# Patient Record
Sex: Male | Born: 1935 | Race: White | Hispanic: No | Marital: Married
Health system: Southern US, Community
[De-identification: ages and names within clinical notes are randomized; demographics above are authoritative.]

---

## 1998-01-12 ENCOUNTER — Ambulatory Visit (HOSPITAL_COMMUNITY): Admission: RE | Admit: 1998-01-12 | Discharge: 1998-01-12 | Payer: Self-pay | Admitting: Neurosurgery

## 2004-08-08 ENCOUNTER — Ambulatory Visit: Payer: Self-pay | Admitting: Dermatology

## 2005-04-10 ENCOUNTER — Other Ambulatory Visit: Payer: Self-pay

## 2005-04-10 ENCOUNTER — Ambulatory Visit: Payer: Self-pay | Admitting: Ophthalmology

## 2005-04-17 ENCOUNTER — Ambulatory Visit: Payer: Self-pay | Admitting: Ophthalmology

## 2006-04-18 ENCOUNTER — Inpatient Hospital Stay: Payer: Self-pay | Admitting: Internal Medicine

## 2006-04-18 ENCOUNTER — Other Ambulatory Visit: Payer: Self-pay

## 2007-04-26 ENCOUNTER — Other Ambulatory Visit: Payer: Self-pay

## 2007-04-26 ENCOUNTER — Emergency Department: Payer: Self-pay | Admitting: Emergency Medicine

## 2007-06-03 ENCOUNTER — Other Ambulatory Visit: Payer: Self-pay

## 2007-06-03 ENCOUNTER — Inpatient Hospital Stay: Payer: Self-pay | Admitting: Internal Medicine

## 2007-07-02 ENCOUNTER — Inpatient Hospital Stay: Payer: Self-pay | Admitting: Internal Medicine

## 2007-07-02 ENCOUNTER — Other Ambulatory Visit: Payer: Self-pay

## 2007-07-10 ENCOUNTER — Inpatient Hospital Stay: Payer: Self-pay | Admitting: Internal Medicine

## 2007-08-19 ENCOUNTER — Ambulatory Visit: Payer: Self-pay | Admitting: Internal Medicine

## 2007-09-06 ENCOUNTER — Other Ambulatory Visit: Payer: Self-pay

## 2007-09-06 ENCOUNTER — Inpatient Hospital Stay: Payer: Self-pay | Admitting: Internal Medicine

## 2007-12-10 ENCOUNTER — Ambulatory Visit: Payer: Self-pay | Admitting: Podiatry

## 2008-04-21 ENCOUNTER — Ambulatory Visit: Payer: Self-pay | Admitting: Ophthalmology

## 2008-04-27 ENCOUNTER — Ambulatory Visit: Payer: Self-pay | Admitting: Ophthalmology

## 2008-09-19 ENCOUNTER — Emergency Department: Payer: Self-pay | Admitting: Emergency Medicine

## 2008-12-25 ENCOUNTER — Emergency Department: Payer: Self-pay | Admitting: Emergency Medicine

## 2009-01-01 ENCOUNTER — Ambulatory Visit: Payer: Self-pay | Admitting: Internal Medicine

## 2010-09-02 ENCOUNTER — Ambulatory Visit: Payer: Self-pay | Admitting: Internal Medicine

## 2010-09-10 ENCOUNTER — Inpatient Hospital Stay: Payer: Self-pay | Admitting: Internal Medicine

## 2010-10-02 ENCOUNTER — Inpatient Hospital Stay: Payer: Self-pay | Admitting: Internal Medicine

## 2010-10-02 ENCOUNTER — Ambulatory Visit: Payer: Self-pay | Admitting: Internal Medicine

## 2011-03-31 ENCOUNTER — Emergency Department: Payer: Self-pay | Admitting: Emergency Medicine

## 2011-06-02 ENCOUNTER — Ambulatory Visit: Payer: Self-pay | Admitting: Internal Medicine

## 2011-06-20 ENCOUNTER — Inpatient Hospital Stay: Payer: Self-pay | Admitting: Internal Medicine

## 2011-06-20 LAB — CK TOTAL AND CKMB (NOT AT ARMC)
CK, Total: 71 U/L (ref 35–232)
CK-MB: 1.5 ng/mL (ref 0.5–3.6)
CK-MB: 2.6 ng/mL (ref 0.5–3.6)

## 2011-06-20 LAB — PROTIME-INR
INR: 1
Prothrombin Time: 13.4 secs (ref 11.5–14.7)

## 2011-06-20 LAB — COMPREHENSIVE METABOLIC PANEL
Albumin: 2.8 g/dL — ABNORMAL LOW (ref 3.4–5.0)
Alkaline Phosphatase: 44 U/L — ABNORMAL LOW (ref 50–136)
BUN: 55 mg/dL — ABNORMAL HIGH (ref 7–18)
Bilirubin,Total: 0.4 mg/dL (ref 0.2–1.0)
Chloride: 102 mmol/L (ref 98–107)
Creatinine: 2.95 mg/dL — ABNORMAL HIGH (ref 0.60–1.30)
EGFR (Non-African Amer.): 21 — ABNORMAL LOW
Glucose: 80 mg/dL (ref 65–99)
Osmolality: 290 (ref 275–301)
SGPT (ALT): 16 U/L
Sodium: 138 mmol/L (ref 136–145)

## 2011-06-20 LAB — CBC
HCT: 39 % — ABNORMAL LOW (ref 40.0–52.0)
HGB: 12.4 g/dL — ABNORMAL LOW (ref 13.0–18.0)
MCH: 28.8 pg (ref 26.0–34.0)
MCHC: 31.7 g/dL — ABNORMAL LOW (ref 32.0–36.0)

## 2011-06-20 LAB — TROPONIN I
Troponin-I: 0.05 ng/mL
Troponin-I: 0.04 ng/mL

## 2011-06-20 LAB — TSH: Thyroid Stimulating Horm: <0.01 u[IU]/mL — ABNORMAL LOW

## 2011-06-21 LAB — CBC WITH DIFFERENTIAL/PLATELET
HGB: 12.3 g/dL — ABNORMAL LOW (ref 13.0–18.0)
Lymphocyte #: 1 10*3/uL (ref 1.0–3.6)
Lymphocyte %: 5.2 %
MCH: 29.7 pg (ref 26.0–34.0)
MCHC: 32.5 g/dL (ref 32.0–36.0)
MCV: 92 fL (ref 80–100)
Monocyte %: 9.1 %
Neutrophil #: 16.3 10*3/uL — ABNORMAL HIGH (ref 1.4–6.5)
Neutrophil %: 84.5 %
Platelet: 295 10*3/uL (ref 150–440)
RBC: 4.13 10*6/uL — ABNORMAL LOW (ref 4.40–5.90)
RDW: 13.6 % (ref 11.5–14.5)
WBC: 19.3 10*3/uL — ABNORMAL HIGH (ref 3.8–10.6)

## 2011-06-21 LAB — COMPREHENSIVE METABOLIC PANEL
Alkaline Phosphatase: 42 U/L — ABNORMAL LOW (ref 50–136)
Calcium, Total: 8.2 mg/dL — ABNORMAL LOW (ref 8.5–10.1)
Co2: 27 mmol/L (ref 21–32)
Creatinine: 2.79 mg/dL — ABNORMAL HIGH (ref 0.60–1.30)
EGFR (Non-African Amer.): 23 — ABNORMAL LOW
SGPT (ALT): 13 U/L
Sodium: 142 mmol/L (ref 136–145)

## 2011-06-21 LAB — PROTIME-INR
INR: 1
Prothrombin Time: 13.8 secs (ref 11.5–14.7)

## 2011-06-21 LAB — URINALYSIS, COMPLETE
Glucose,UR: 150 mg/dL (ref 0–75)
Hyaline Cast: 24
Nitrite: NEGATIVE
Ph: 5 (ref 4.5–8.0)
Protein: 100
Specific Gravity: 1.018 (ref 1.003–1.030)
WBC UR: 17 /HPF (ref 0–5)

## 2011-06-21 LAB — CK TOTAL AND CKMB (NOT AT ARMC)
CK, Total: 54 U/L (ref 35–232)
CK-MB: 2.3 ng/mL (ref 0.5–3.6)

## 2011-06-22 LAB — BASIC METABOLIC PANEL
Anion Gap: 14 (ref 7–16)
Calcium, Total: 8.1 mg/dL — ABNORMAL LOW (ref 8.5–10.1)
Chloride: 105 mmol/L (ref 98–107)
Co2: 21 mmol/L (ref 21–32)
Creatinine: 3.05 mg/dL — ABNORMAL HIGH (ref 0.60–1.30)
Osmolality: 302 (ref 275–301)
Sodium: 140 mmol/L (ref 136–145)

## 2011-06-22 LAB — CBC WITH DIFFERENTIAL/PLATELET
Basophil #: 0 10*3/uL (ref 0.0–0.1)
Basophil %: 0.3 %
Eosinophil #: 0.7 10*3/uL (ref 0.0–0.7)
Eosinophil %: 4.3 %
HCT: 34.5 % — ABNORMAL LOW (ref 40.0–52.0)
MCH: 29.9 pg (ref 26.0–34.0)
MCV: 92 fL (ref 80–100)
Monocyte %: 10.7 %
Neutrophil %: 76.4 %
Platelet: 277 10*3/uL (ref 150–440)
RBC: 3.77 10*6/uL — ABNORMAL LOW (ref 4.40–5.90)
WBC: 16.4 10*3/uL — ABNORMAL HIGH (ref 3.8–10.6)

## 2011-06-23 LAB — BASIC METABOLIC PANEL
Anion Gap: 9 (ref 7–16)
BUN: 63 mg/dL — ABNORMAL HIGH (ref 7–18)
Chloride: 103 mmol/L (ref 98–107)
Co2: 26 mmol/L (ref 21–32)
EGFR (Non-African Amer.): 19 — ABNORMAL LOW
Osmolality: 299 (ref 275–301)
Potassium: 4.9 mmol/L (ref 3.5–5.1)
Sodium: 138 mmol/L (ref 136–145)

## 2011-06-23 LAB — URINALYSIS, COMPLETE
Hyaline Cast: 27
Ketone: NEGATIVE
Nitrite: NEGATIVE
Ph: 5 (ref 4.5–8.0)
Protein: 30
RBC,UR: 39 /HPF (ref 0–5)
Specific Gravity: 1.019 (ref 1.003–1.030)
WBC UR: 137 /HPF (ref 0–5)

## 2011-06-23 LAB — HEPATIC FUNCTION PANEL A (ARMC)
Albumin: 2.5 g/dL — ABNORMAL LOW (ref 3.4–5.0)
Bilirubin, Direct: 0.1 mg/dL (ref 0.00–0.20)
Bilirubin,Total: 0.6 mg/dL (ref 0.2–1.0)
SGOT(AST): 17 U/L (ref 15–37)
Total Protein: 7 g/dL (ref 6.4–8.2)

## 2011-06-23 LAB — HEMATOCRIT: HCT: 34.3 % — ABNORMAL LOW (ref 40.0–52.0)

## 2011-06-24 LAB — CBC WITH DIFFERENTIAL/PLATELET
Basophil %: 0.3 %
Eosinophil %: 4.8 %
HCT: 34.8 % — ABNORMAL LOW (ref 40.0–52.0)
MCH: 29.9 pg (ref 26.0–34.0)
MCV: 91 fL (ref 80–100)
Monocyte %: 7.9 %
Neutrophil %: 78.3 %
Platelet: 311 10*3/uL (ref 150–440)
RBC: 3.81 10*6/uL — ABNORMAL LOW (ref 4.40–5.90)
WBC: 18 10*3/uL — ABNORMAL HIGH (ref 3.8–10.6)

## 2011-06-24 LAB — BASIC METABOLIC PANEL
Anion Gap: 9 (ref 7–16)
BUN: 70 mg/dL — ABNORMAL HIGH (ref 7–18)
Chloride: 106 mmol/L (ref 98–107)
Co2: 27 mmol/L (ref 21–32)
EGFR (African American): 22 — ABNORMAL LOW
EGFR (Non-African Amer.): 18 — ABNORMAL LOW
Glucose: 121 mg/dL — ABNORMAL HIGH (ref 65–99)
Osmolality: 305 (ref 275–301)

## 2011-06-24 LAB — IRON AND TIBC
Iron Bind.Cap.(Total): 204 ug/dL — ABNORMAL LOW (ref 250–450)
Iron Saturation: 14 %
Unbound Iron-Bind.Cap.: 176 ug/dL

## 2011-06-24 LAB — FERRITIN: Ferritin (ARMC): 255 ng/mL (ref 8–388)

## 2011-06-25 LAB — URINE CULTURE

## 2011-06-25 LAB — BASIC METABOLIC PANEL
Anion Gap: 12 (ref 7–16)
BUN: 73 mg/dL — ABNORMAL HIGH (ref 7–18)
Chloride: 106 mmol/L (ref 98–107)
Co2: 22 mmol/L (ref 21–32)
EGFR (African American): 23 — ABNORMAL LOW
EGFR (Non-African Amer.): 19 — ABNORMAL LOW
Glucose: 209 mg/dL — ABNORMAL HIGH (ref 65–99)
Osmolality: 307 (ref 275–301)
Sodium: 140 mmol/L (ref 136–145)

## 2011-06-26 LAB — CBC WITH DIFFERENTIAL/PLATELET
Basophil #: 0 10*3/uL (ref 0.0–0.1)
Basophil %: 0 %
Eosinophil #: 0 10*3/uL (ref 0.0–0.7)
Eosinophil %: 0 %
HCT: 34.2 % — ABNORMAL LOW (ref 40.0–52.0)
HGB: 11.1 g/dL — ABNORMAL LOW (ref 13.0–18.0)
Lymphocyte #: 0.9 10*3/uL — ABNORMAL LOW (ref 1.0–3.6)
MCH: 29.8 pg (ref 26.0–34.0)
MCHC: 32.6 g/dL (ref 32.0–36.0)
MCV: 92 fL (ref 80–100)
Monocyte #: 1.1 10*3/uL — ABNORMAL HIGH (ref 0.0–0.7)
Monocyte %: 6.1 %
RBC: 3.74 10*6/uL — ABNORMAL LOW (ref 4.40–5.90)
RDW: 12.7 % (ref 11.5–14.5)

## 2011-06-26 LAB — BASIC METABOLIC PANEL
Anion Gap: 11 (ref 7–16)
BUN: 77 mg/dL — ABNORMAL HIGH (ref 7–18)
Calcium, Total: 8.4 mg/dL — ABNORMAL LOW (ref 8.5–10.1)
Chloride: 107 mmol/L (ref 98–107)
Co2: 23 mmol/L (ref 21–32)
Creatinine: 3.1 mg/dL — ABNORMAL HIGH (ref 0.60–1.30)
Sodium: 141 mmol/L (ref 136–145)

## 2011-06-26 LAB — CULTURE, BLOOD (SINGLE)

## 2011-06-27 LAB — BASIC METABOLIC PANEL
Anion Gap: 10 (ref 7–16)
BUN: 79 mg/dL — ABNORMAL HIGH (ref 7–18)
Calcium, Total: 8.2 mg/dL — ABNORMAL LOW (ref 8.5–10.1)
Chloride: 108 mmol/L — ABNORMAL HIGH (ref 98–107)
Co2: 25 mmol/L (ref 21–32)
Creatinine: 3.11 mg/dL — ABNORMAL HIGH (ref 0.60–1.30)
EGFR (African American): 24 — ABNORMAL LOW
EGFR (Non-African Amer.): 20 — ABNORMAL LOW
Glucose: 221 mg/dL — ABNORMAL HIGH (ref 65–99)
Osmolality: 315 (ref 275–301)
Potassium: 5.9 mmol/L — ABNORMAL HIGH (ref 3.5–5.1)

## 2011-06-27 LAB — SEDIMENTATION RATE: Erythrocyte Sed Rate: 18 mm/hr (ref 0–20)

## 2011-06-28 LAB — BASIC METABOLIC PANEL
Anion Gap: 9 (ref 7–16)
BUN: 80 mg/dL — ABNORMAL HIGH (ref 7–18)
Chloride: 108 mmol/L — ABNORMAL HIGH (ref 98–107)
Creatinine: 3.07 mg/dL — ABNORMAL HIGH (ref 0.60–1.30)
EGFR (Non-African Amer.): 20 — ABNORMAL LOW

## 2011-06-28 LAB — CBC WITH DIFFERENTIAL/PLATELET
Basophil %: 0 %
HCT: 35.8 % — ABNORMAL LOW (ref 40.0–52.0)
HGB: 11.6 g/dL — ABNORMAL LOW (ref 13.0–18.0)
Lymphocyte %: 6.1 %
Monocyte #: 1.3 10*3/uL — ABNORMAL HIGH (ref 0.0–0.7)
Neutrophil #: 18.5 10*3/uL — ABNORMAL HIGH (ref 1.4–6.5)
RBC: 3.91 10*6/uL — ABNORMAL LOW (ref 4.40–5.90)
RDW: 13.4 % (ref 11.5–14.5)
WBC: 21.1 10*3/uL — ABNORMAL HIGH (ref 3.8–10.6)

## 2011-06-28 LAB — CULTURE, BLOOD (SINGLE)

## 2011-06-29 LAB — BASIC METABOLIC PANEL
BUN: 77 mg/dL — ABNORMAL HIGH (ref 7–18)
Creatinine: 2.88 mg/dL — ABNORMAL HIGH (ref 0.60–1.30)
EGFR (Non-African Amer.): 22 — ABNORMAL LOW
Glucose: 177 mg/dL — ABNORMAL HIGH (ref 65–99)
Osmolality: 310 (ref 275–301)
Potassium: 6.1 mmol/L — ABNORMAL HIGH (ref 3.5–5.1)
Sodium: 142 mmol/L (ref 136–145)

## 2011-06-29 LAB — WBC: WBC: 21.3 10*3/uL — ABNORMAL HIGH (ref 3.8–10.6)

## 2011-06-30 LAB — BASIC METABOLIC PANEL
BUN: 74 mg/dL — ABNORMAL HIGH (ref 7–18)
Calcium, Total: 7.9 mg/dL — ABNORMAL LOW (ref 8.5–10.1)
Chloride: 107 mmol/L (ref 98–107)
Co2: 25 mmol/L (ref 21–32)
Creatinine: 2.73 mg/dL — ABNORMAL HIGH (ref 0.60–1.30)
EGFR (African American): 28 — ABNORMAL LOW
Glucose: 170 mg/dL — ABNORMAL HIGH (ref 65–99)
Osmolality: 305 (ref 275–301)
Potassium: 4.9 mmol/L (ref 3.5–5.1)
Sodium: 140 mmol/L (ref 136–145)

## 2011-06-30 LAB — CBC WITH DIFFERENTIAL/PLATELET
Basophil #: 0 10*3/uL (ref 0.0–0.1)
Eosinophil %: 0.5 %
HCT: 35 % — ABNORMAL LOW (ref 40.0–52.0)
HGB: 11.3 g/dL — ABNORMAL LOW (ref 13.0–18.0)
MCH: 29.6 pg (ref 26.0–34.0)
MCHC: 32.3 g/dL (ref 32.0–36.0)
MCV: 91 fL (ref 80–100)
Monocyte #: 1.1 10*3/uL — ABNORMAL HIGH (ref 0.0–0.7)
Neutrophil #: 19.6 10*3/uL — ABNORMAL HIGH (ref 1.4–6.5)
Neutrophil %: 89.4 %
Platelet: 333 10*3/uL (ref 150–440)
RBC: 3.82 10*6/uL — ABNORMAL LOW (ref 4.40–5.90)
WBC: 22 10*3/uL — ABNORMAL HIGH (ref 3.8–10.6)

## 2011-07-01 ENCOUNTER — Ambulatory Visit: Payer: Self-pay | Admitting: Urology

## 2011-07-01 LAB — CBC WITH DIFFERENTIAL/PLATELET
Basophil #: 0 10*3/uL (ref 0.0–0.1)
Basophil %: 0 %
Eosinophil #: 0.1 10*3/uL (ref 0.0–0.7)
Eosinophil %: 0.4 %
Lymphocyte #: 1.3 10*3/uL (ref 1.0–3.6)
MCH: 29.5 pg (ref 26.0–34.0)
MCHC: 32.5 g/dL (ref 32.0–36.0)
Monocyte #: 1.1 10*3/uL — ABNORMAL HIGH (ref 0.0–0.7)
Monocyte %: 5.4 %
Neutrophil #: 18.3 10*3/uL — ABNORMAL HIGH (ref 1.4–6.5)
Neutrophil %: 88 %
Platelet: 315 10*3/uL (ref 150–440)
RDW: 13.1 % (ref 11.5–14.5)

## 2011-07-01 LAB — BASIC METABOLIC PANEL
Anion Gap: 7 (ref 7–16)
BUN: 72 mg/dL — ABNORMAL HIGH (ref 7–18)
Chloride: 108 mmol/L — ABNORMAL HIGH (ref 98–107)
Co2: 28 mmol/L (ref 21–32)
Creatinine: 2.52 mg/dL — ABNORMAL HIGH (ref 0.60–1.30)
EGFR (African American): 31 — ABNORMAL LOW
EGFR (Non-African Amer.): 26 — ABNORMAL LOW
Glucose: 134 mg/dL — ABNORMAL HIGH (ref 65–99)
Osmolality: 308 (ref 275–301)
Sodium: 143 mmol/L (ref 136–145)

## 2011-07-02 LAB — BASIC METABOLIC PANEL
Anion Gap: 8 (ref 7–16)
BUN: 66 mg/dL — ABNORMAL HIGH (ref 7–18)
Calcium, Total: 7.9 mg/dL — ABNORMAL LOW (ref 8.5–10.1)
Co2: 27 mmol/L (ref 21–32)
Creatinine: 2.64 mg/dL — ABNORMAL HIGH (ref 0.60–1.30)
EGFR (Non-African Amer.): 24 — ABNORMAL LOW
Glucose: 61 mg/dL — ABNORMAL LOW (ref 65–99)
Potassium: 4.2 mmol/L (ref 3.5–5.1)

## 2011-07-03 ENCOUNTER — Ambulatory Visit: Payer: Self-pay | Admitting: Internal Medicine

## 2011-07-03 LAB — CULTURE, BLOOD (SINGLE)

## 2011-07-04 LAB — BASIC METABOLIC PANEL
Anion Gap: 8 (ref 7–16)
BUN: 58 mg/dL — ABNORMAL HIGH (ref 7–18)
Calcium, Total: 8 mg/dL — ABNORMAL LOW (ref 8.5–10.1)
Chloride: 107 mmol/L (ref 98–107)
Co2: 26 mmol/L (ref 21–32)
EGFR (African American): 32 — ABNORMAL LOW
EGFR (Non-African Amer.): 26 — ABNORMAL LOW
Glucose: 107 mg/dL — ABNORMAL HIGH (ref 65–99)
Osmolality: 298 (ref 275–301)
Potassium: 4.7 mmol/L (ref 3.5–5.1)

## 2011-07-04 LAB — CBC WITH DIFFERENTIAL/PLATELET
Basophil #: 0 10*3/uL (ref 0.0–0.1)
Basophil %: 0.2 %
Eosinophil %: 2.5 %
HGB: 11 g/dL — ABNORMAL LOW (ref 13.0–18.0)
Lymphocyte %: 8.5 %
MCH: 29.7 pg (ref 26.0–34.0)
MCV: 92 fL (ref 80–100)
Monocyte #: 1.2 10*3/uL — ABNORMAL HIGH (ref 0.0–0.7)
Platelet: 303 10*3/uL (ref 150–440)
RBC: 3.71 10*6/uL — ABNORMAL LOW (ref 4.40–5.90)
RDW: 13.3 % (ref 11.5–14.5)
WBC: 20.3 10*3/uL — ABNORMAL HIGH (ref 3.8–10.6)

## 2011-07-05 LAB — WBC: WBC: 18 10*3/uL — ABNORMAL HIGH (ref 3.8–10.6)

## 2011-07-05 LAB — CREATININE, SERUM: Creatinine: 2.57 mg/dL — ABNORMAL HIGH (ref 0.60–1.30)

## 2011-08-02 ENCOUNTER — Ambulatory Visit: Payer: Self-pay | Admitting: Internal Medicine

## 2011-08-09 ENCOUNTER — Inpatient Hospital Stay: Payer: Self-pay | Admitting: Internal Medicine

## 2011-08-09 LAB — CBC WITH DIFFERENTIAL/PLATELET
Basophil %: 0.4 %
Eosinophil #: 0.2 10*3/uL (ref 0.0–0.7)
HCT: 38.3 % — ABNORMAL LOW (ref 40.0–52.0)
HGB: 12.5 g/dL — ABNORMAL LOW (ref 13.0–18.0)
Lymphocyte %: 10.4 %
MCV: 90 fL (ref 80–100)
Monocyte %: 12.3 %
Neutrophil #: 10.6 10*3/uL — ABNORMAL HIGH (ref 1.4–6.5)
Neutrophil %: 75.5 %
Platelet: 342 10*3/uL (ref 150–440)
RBC: 4.26 10*6/uL — ABNORMAL LOW (ref 4.40–5.90)
RDW: 15.6 % — ABNORMAL HIGH (ref 11.5–14.5)

## 2011-08-09 LAB — CK TOTAL AND CKMB (NOT AT ARMC)
CK, Total: 418 U/L — ABNORMAL HIGH (ref 35–232)
CK, Total: 484 U/L — ABNORMAL HIGH (ref 35–232)
CK-MB: 2 ng/mL (ref 0.5–3.6)
CK-MB: 2.6 ng/mL (ref 0.5–3.6)

## 2011-08-09 LAB — COMPREHENSIVE METABOLIC PANEL
Alkaline Phosphatase: 62 U/L (ref 50–136)
Anion Gap: 13 (ref 7–16)
BUN: 17 mg/dL (ref 7–18)
Calcium, Total: 8.8 mg/dL (ref 8.5–10.1)
Glucose: 198 mg/dL — ABNORMAL HIGH (ref 65–99)
SGOT(AST): 38 U/L — ABNORMAL HIGH (ref 15–37)
SGPT (ALT): 21 U/L
Sodium: 139 mmol/L (ref 136–145)
Total Protein: 7.1 g/dL (ref 6.4–8.2)

## 2011-08-09 LAB — TROPONIN I
Troponin-I: 0.27 ng/mL — ABNORMAL HIGH
Troponin-I: 0.26 ng/mL — ABNORMAL HIGH

## 2011-08-10 LAB — COMPREHENSIVE METABOLIC PANEL
Albumin: 2 g/dL — ABNORMAL LOW (ref 3.4–5.0)
Alkaline Phosphatase: 42 U/L — ABNORMAL LOW (ref 50–136)
BUN: 19 mg/dL — ABNORMAL HIGH (ref 7–18)
Bilirubin,Total: 0.4 mg/dL (ref 0.2–1.0)
Calcium, Total: 7.3 mg/dL — ABNORMAL LOW (ref 8.5–10.1)
EGFR (African American): 30 — ABNORMAL LOW
Glucose: 124 mg/dL — ABNORMAL HIGH (ref 65–99)
Osmolality: 291 (ref 275–301)
Potassium: 3.6 mmol/L (ref 3.5–5.1)
SGOT(AST): 37 U/L (ref 15–37)
Total Protein: 5.5 g/dL — ABNORMAL LOW (ref 6.4–8.2)

## 2011-08-10 LAB — CBC WITH DIFFERENTIAL/PLATELET
Basophil #: 0 10*3/uL (ref 0.0–0.1)
Basophil %: 0.5 %
Eosinophil #: 0.6 10*3/uL (ref 0.0–0.7)
Eosinophil %: 5.7 %
HCT: 33.3 % — ABNORMAL LOW (ref 40.0–52.0)
HGB: 10.6 g/dL — ABNORMAL LOW (ref 13.0–18.0)
Lymphocyte %: 10.9 %
MCV: 92 fL (ref 80–100)
Monocyte %: 13.4 %
Neutrophil #: 7.3 10*3/uL — ABNORMAL HIGH (ref 1.4–6.5)
Neutrophil %: 69.5 %
Platelet: 270 10*3/uL (ref 150–440)
RBC: 3.64 10*6/uL — ABNORMAL LOW (ref 4.40–5.90)
RDW: 15.9 % — ABNORMAL HIGH (ref 11.5–14.5)

## 2011-08-10 LAB — URINALYSIS, COMPLETE
Bilirubin,UR: NEGATIVE
Glucose,UR: NEGATIVE mg/dL (ref 0–75)
Hyaline Cast: 4
Ketone: NEGATIVE
Ph: 5 (ref 4.5–8.0)
RBC,UR: 844 /HPF (ref 0–5)
Specific Gravity: 1.016 (ref 1.003–1.030)
WBC UR: 120 /HPF (ref 0–5)

## 2011-08-10 LAB — CK TOTAL AND CKMB (NOT AT ARMC): CK-MB: 2.5 ng/mL (ref 0.5–3.6)

## 2011-08-11 LAB — BASIC METABOLIC PANEL
BUN: 25 mg/dL — ABNORMAL HIGH (ref 7–18)
Calcium, Total: 7.4 mg/dL — ABNORMAL LOW (ref 8.5–10.1)
Creatinine: 2.55 mg/dL — ABNORMAL HIGH (ref 0.60–1.30)
EGFR (Non-African Amer.): 21 — ABNORMAL LOW
Glucose: 136 mg/dL — ABNORMAL HIGH (ref 65–99)
Sodium: 143 mmol/L (ref 136–145)

## 2011-08-11 LAB — CBC WITH DIFFERENTIAL/PLATELET
HGB: 10.5 g/dL — ABNORMAL LOW (ref 13.0–18.0)
Lymphocyte #: 1.5 10*3/uL (ref 1.0–3.6)
Lymphocyte %: 13.4 %
MCH: 29.3 pg (ref 26.0–34.0)
MCV: 92 fL (ref 80–100)
Monocyte #: 1.4 x10 3/mm — ABNORMAL HIGH (ref 0.2–1.0)
Neutrophil #: 6.8 10*3/uL — ABNORMAL HIGH (ref 1.4–6.5)
Neutrophil %: 59.9 %
Platelet: 282 10*3/uL (ref 150–440)
RBC: 3.59 10*6/uL — ABNORMAL LOW (ref 4.40–5.90)
RDW: 15.8 % — ABNORMAL HIGH (ref 11.5–14.5)

## 2011-08-11 LAB — URINE CULTURE

## 2011-08-12 LAB — CBC WITH DIFFERENTIAL/PLATELET
Basophil #: 0 10*3/uL (ref 0.0–0.1)
Basophil %: 0.4 %
Eosinophil %: 15.7 %
HCT: 31.6 % — ABNORMAL LOW (ref 40.0–52.0)
HGB: 10.2 g/dL — ABNORMAL LOW (ref 13.0–18.0)
MCH: 29.7 pg (ref 26.0–34.0)
MCHC: 32.4 g/dL (ref 32.0–36.0)
Monocyte #: 0.9 x10 3/mm (ref 0.2–1.0)
Neutrophil #: 6.1 10*3/uL (ref 1.4–6.5)
Neutrophil %: 60.6 %
Platelet: 277 10*3/uL (ref 150–440)
RBC: 3.44 10*6/uL — ABNORMAL LOW (ref 4.40–5.90)
WBC: 10.1 10*3/uL (ref 3.8–10.6)

## 2011-08-12 LAB — BASIC METABOLIC PANEL
Anion Gap: 6 — ABNORMAL LOW (ref 7–16)
BUN: 28 mg/dL — ABNORMAL HIGH (ref 7–18)
Chloride: 109 mmol/L — ABNORMAL HIGH (ref 98–107)
Creatinine: 2.72 mg/dL — ABNORMAL HIGH (ref 0.60–1.30)
Glucose: 174 mg/dL — ABNORMAL HIGH (ref 65–99)
Osmolality: 293 (ref 275–301)
Sodium: 142 mmol/L (ref 136–145)

## 2011-08-13 LAB — BASIC METABOLIC PANEL
Calcium, Total: 7.7 mg/dL — ABNORMAL LOW (ref 8.5–10.1)
Creatinine: 2.48 mg/dL — ABNORMAL HIGH (ref 0.60–1.30)
EGFR (African American): 25 — ABNORMAL LOW
EGFR (Non-African Amer.): 22 — ABNORMAL LOW
Osmolality: 293 (ref 275–301)
Potassium: 3.8 mmol/L (ref 3.5–5.1)

## 2011-08-14 LAB — CBC WITH DIFFERENTIAL/PLATELET
Basophil #: 0.1 10*3/uL (ref 0.0–0.1)
Basophil %: 0.7 %
Eosinophil #: 1.2 10*3/uL — ABNORMAL HIGH (ref 0.0–0.7)
Eosinophil %: 11.5 %
HGB: 10.6 g/dL — ABNORMAL LOW (ref 13.0–18.0)
Lymphocyte %: 12.8 %
Monocyte %: 8.8 %
Neutrophil %: 66.2 %
Platelet: 336 10*3/uL (ref 150–440)
RBC: 3.62 10*6/uL — ABNORMAL LOW (ref 4.40–5.90)
RDW: 15.8 % — ABNORMAL HIGH (ref 11.5–14.5)
WBC: 10.1 10*3/uL (ref 3.8–10.6)

## 2011-08-14 LAB — CREATININE, SERUM
Creatinine: 2.21 mg/dL — ABNORMAL HIGH (ref 0.60–1.30)
EGFR (African American): 29 — ABNORMAL LOW
EGFR (Non-African Amer.): 25 — ABNORMAL LOW

## 2011-09-02 ENCOUNTER — Ambulatory Visit: Payer: Self-pay | Admitting: Internal Medicine

## 2011-09-02 DEATH — deceased

## 2013-07-03 IMAGING — CT CT HEAD WITHOUT CONTRAST
2 series · 15 of 30 positions shown, 19 images · non-contrast
Comparison: none

REASON FOR EXAM: change in ms
COMMENTS:

PROCEDURE:     CT  - CT HEAD WITHOUT CONTRAST  - June 20, 2011 [DATE]
RESULT:     Comparison:  None
TECHNIQUE: Multiple axial images from the foramen magnum to the vertex were
obtained without IV contrast.

[Series 2: without · axial · non-contrast · 0.39mm/px · z∈[+412,+536]mm · 13 of 31 slices shown, 17 images]
[im 3/31  brain]
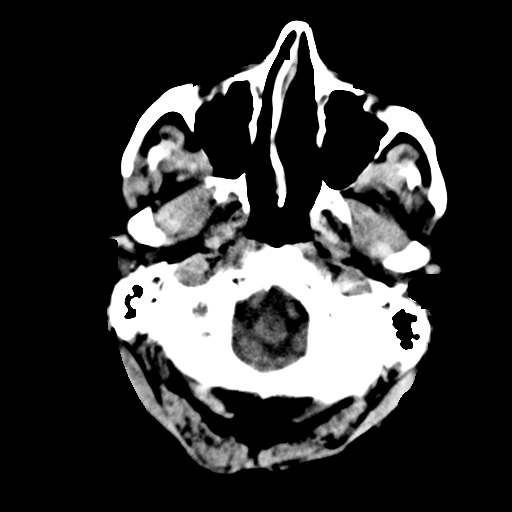
[im 3/31  bone]
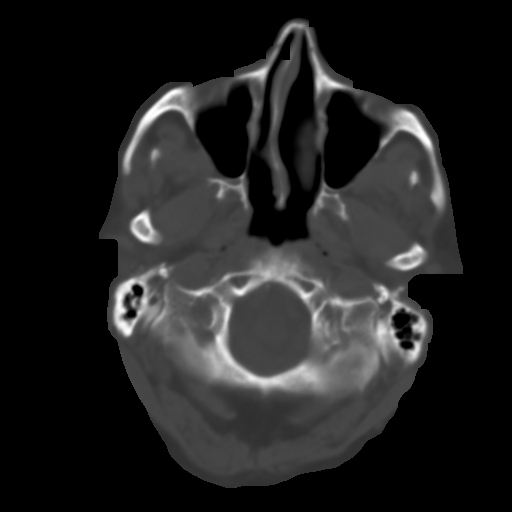
[im 5/31  brain]
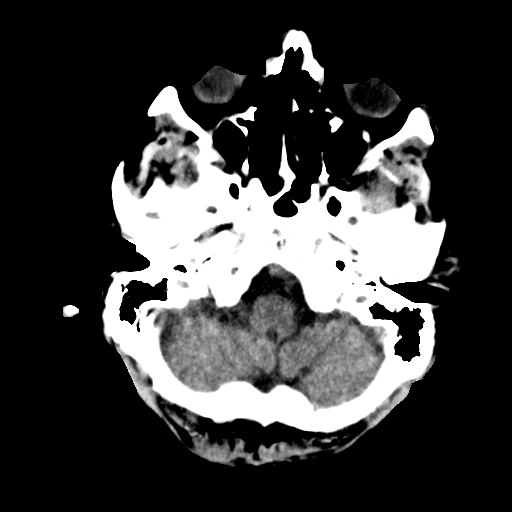
[im 7/31  brain]
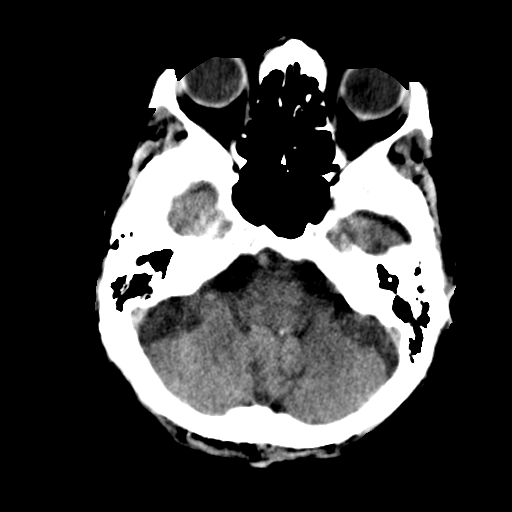
[im 9/31  brain]
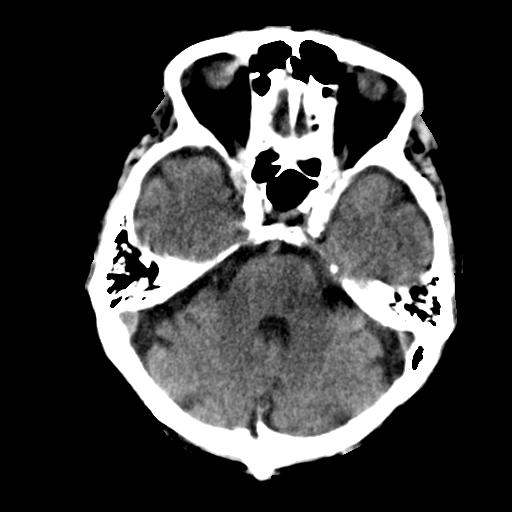
[im 11/31  brain]
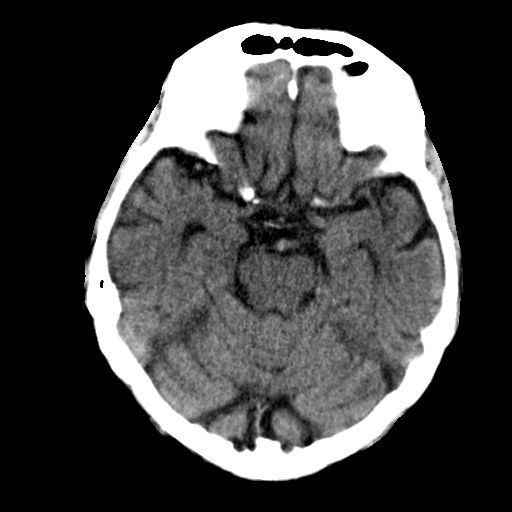
[im 11/31  bone]
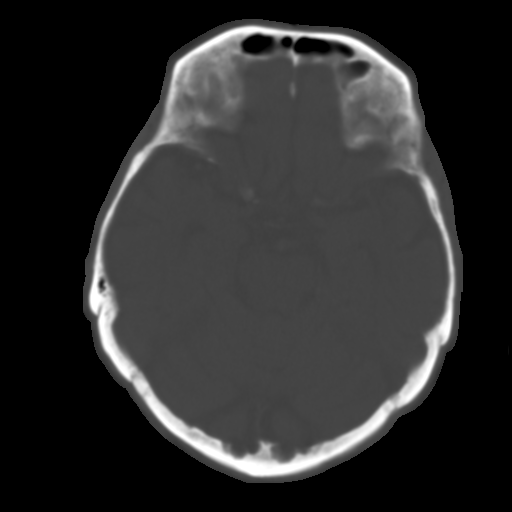
[im 13/31  brain]
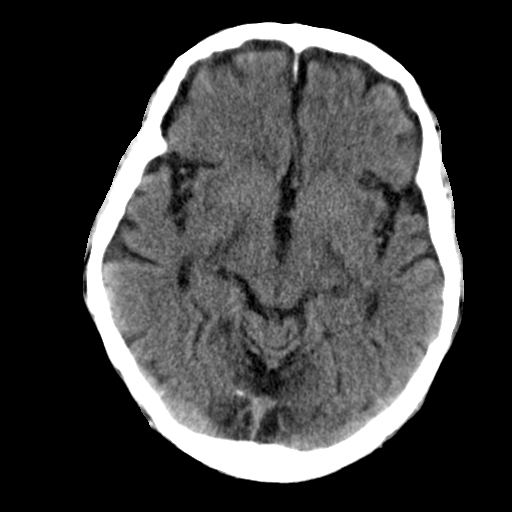
[im 16/31  brain]
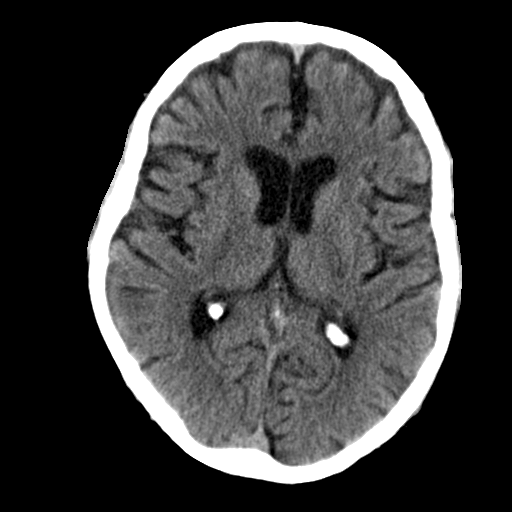
[im 18/31  brain]
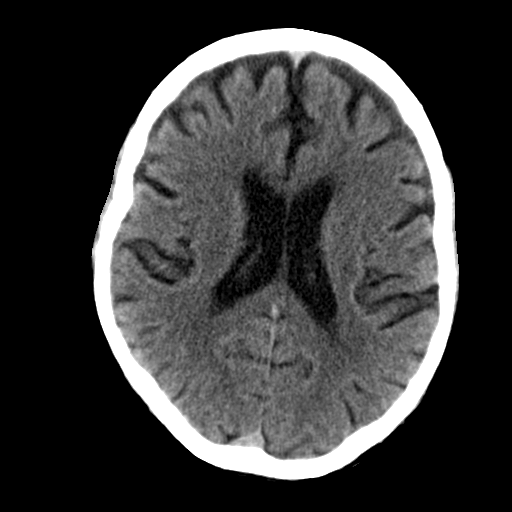
[im 20/31  brain]
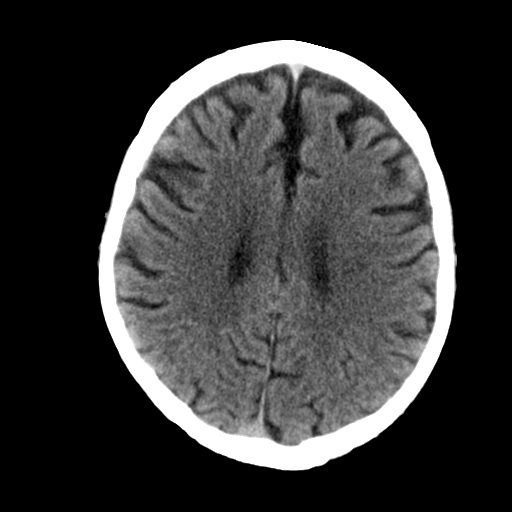
[im 20/31  bone]
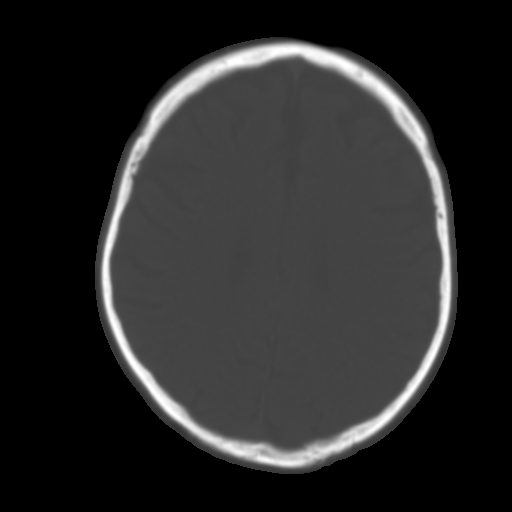
[im 22/31  brain]
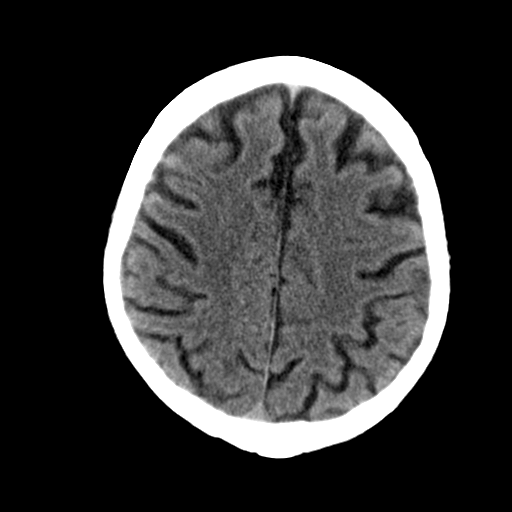
[im 24/31  brain]
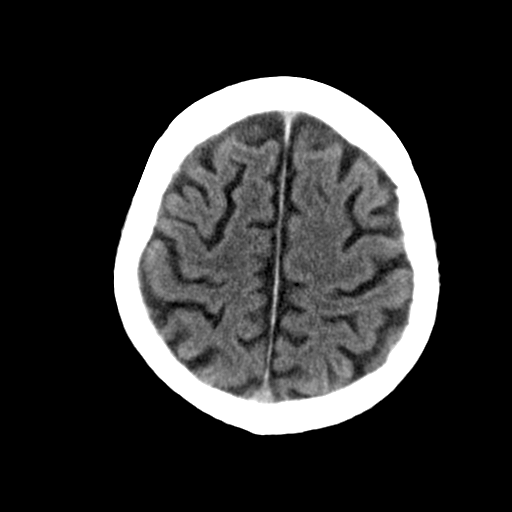
[im 26/31  brain]
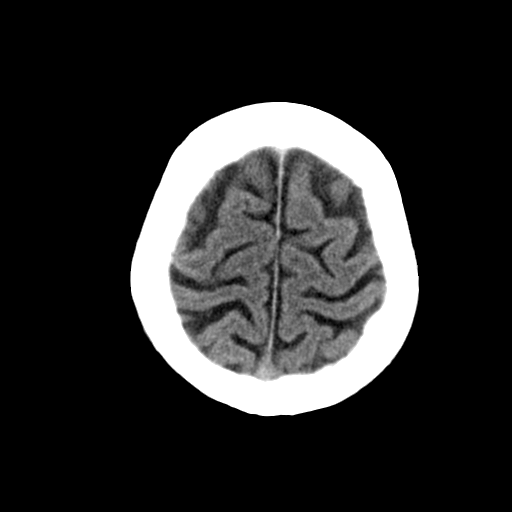
[im 28/31  brain]
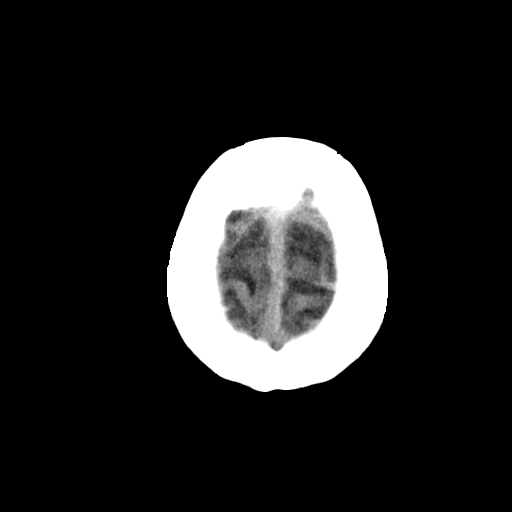
[im 28/31  bone]
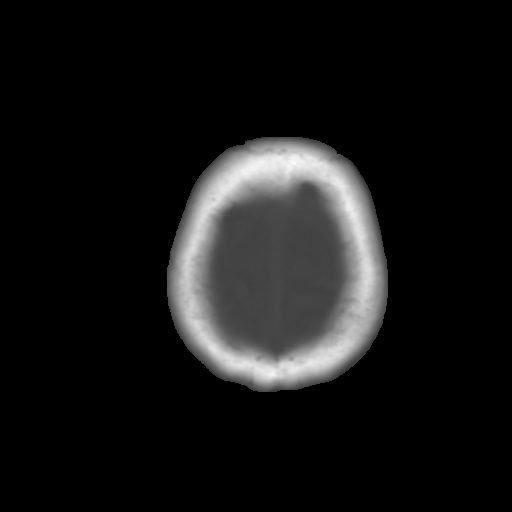

[Series 3: bone · axial · 0.39mm/px · z∈[+412,+432]mm · 2 of 31 slices shown]
[im 3/31  bone]
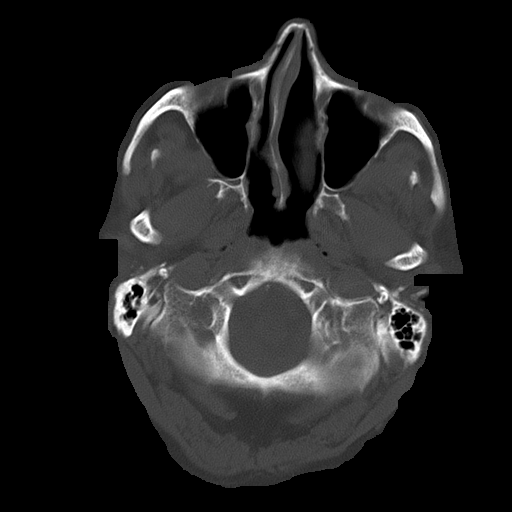
[im 7/31  bone]
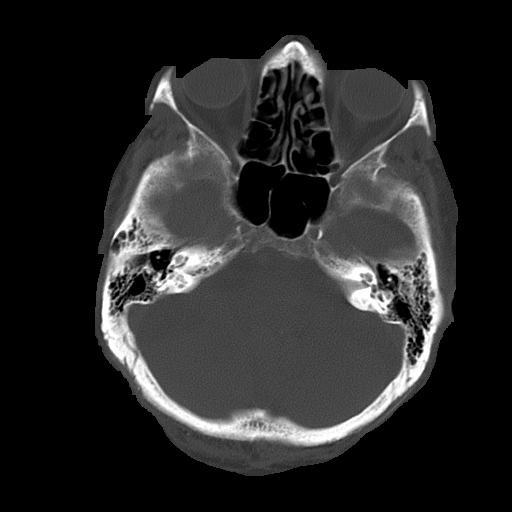

[15 of 30 positions shown; findings below may reference images not displayed]

FINDINGS: There is no evidence of mass effect, midline shift, or extra-axial fluid
collections.  There is no evidence of a space-occupying lesion or
intracranial hemorrhage. There is no evidence of a cortical-based area of
acute infarction. There is generalized cerebral atrophy. There is
periventricular white matter low attenuation likely secondary to
microangiopathy.

The ventricles and sulci are appropriate for the patient's age. The basal
cisterns are patent.

Visualized portions of the orbits are unremarkable. The visualized portions
of the paranasal sinuses and mastoid air cells are unremarkable.
Cerebrovascular atherosclerotic calcifications are noted.

The osseous structures are unremarkable.
IMPRESSION: No acute intracranial process.

## 2013-07-05 IMAGING — CR DG CHEST 1V PORT
1 series · 1 of 1 positions shown · non-contrast
Comparison: none

REASON FOR EXAM: wheezes and shortness of breath
COMMENTS:

PROCEDURE:     DXR - DXR PORTABLE CHEST SINGLE VIEW  - June 22, 2011  [DATE]
RESULT:     Comparison: 06/20/2011

[portable]
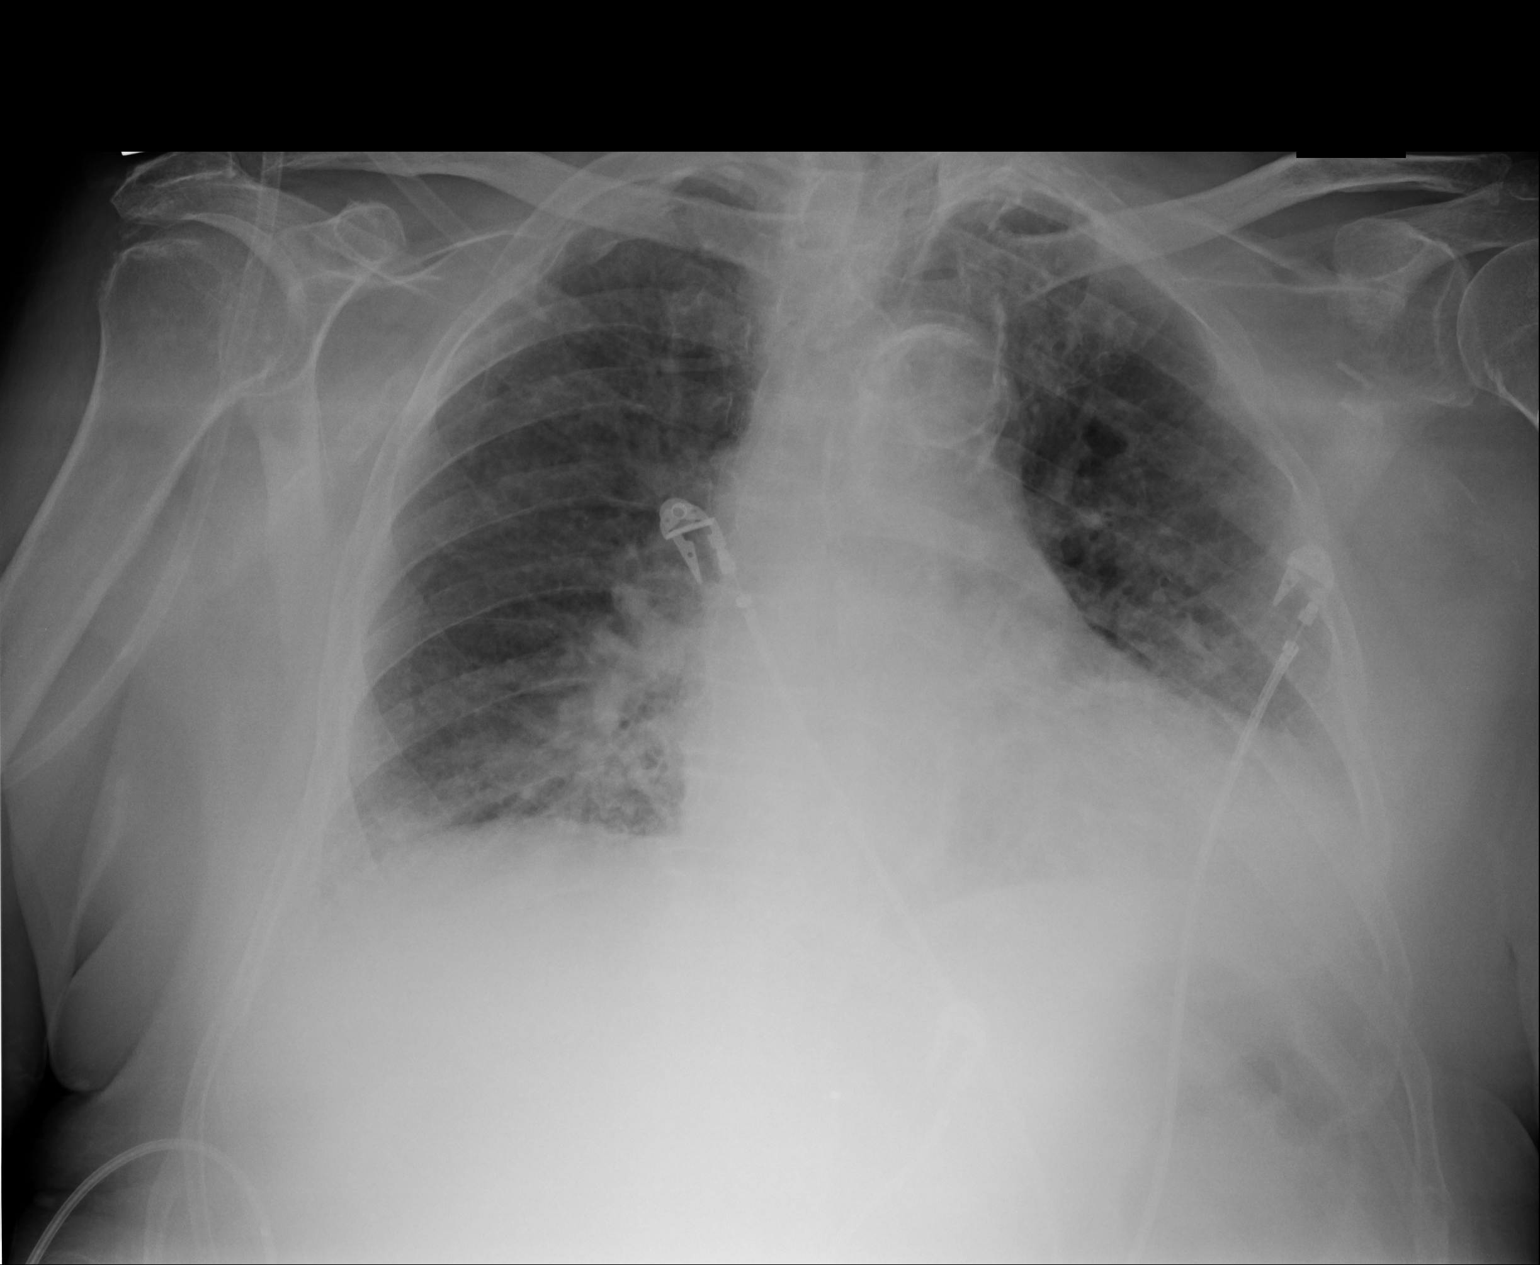

[1 of 1 positions shown; findings below may reference images not displayed]

FINDINGS: Single portable AP chest radiograph is provided. There is bilateral diffuse
interstitial thickening likely representing interstitial edema versus
interstitial pneumonitis secondary to an infectious or inflammatory
etiology. There is no focal parenchymal opacity, pleural effusion, or
pneumothorax. The heart size is enlarged. The osseous structures are
unremarkable.
IMPRESSION: There is bilateral diffuse interstitial thickening likely representing
interstitial edema versus interstitial pneumonitis secondary to an
infectious or inflammatory etiology.

[REDACTED]

## 2013-07-06 IMAGING — US US RENAL KIDNEY
1 series · 17 of 25 positions shown · non-contrast
Comparison: none

REASON FOR EXAM: renal failure
COMMENTS:

[Series 1: us renal kidney · 17 of 28 slices shown]
[im 1/28]
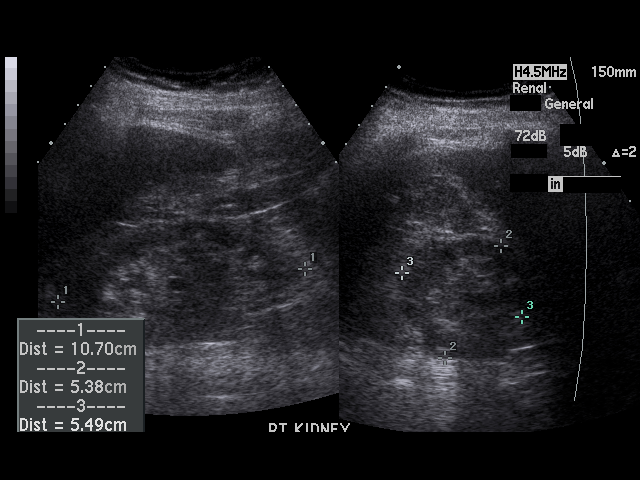
[im 3/28]
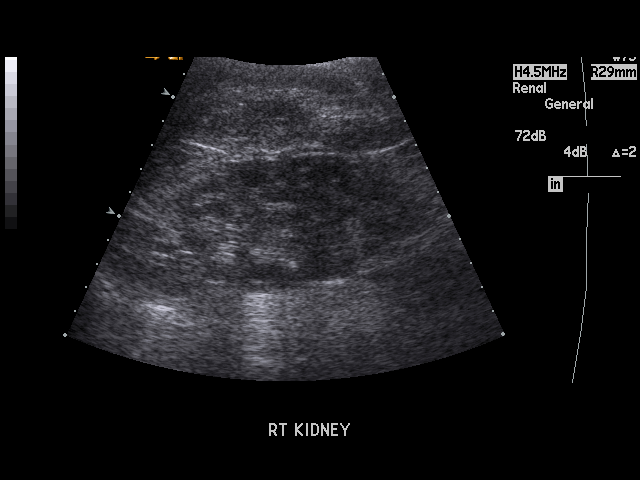
[im 4/28]
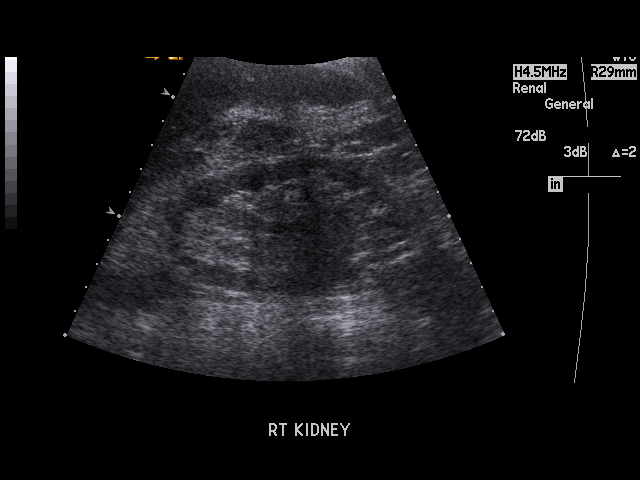
[im 6/28]
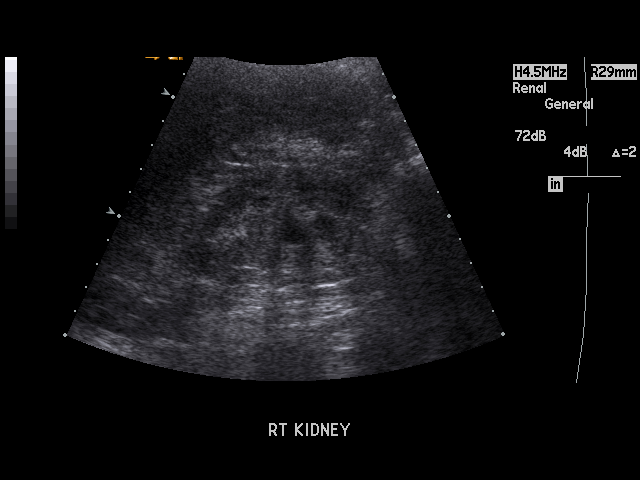
[im 7/28]
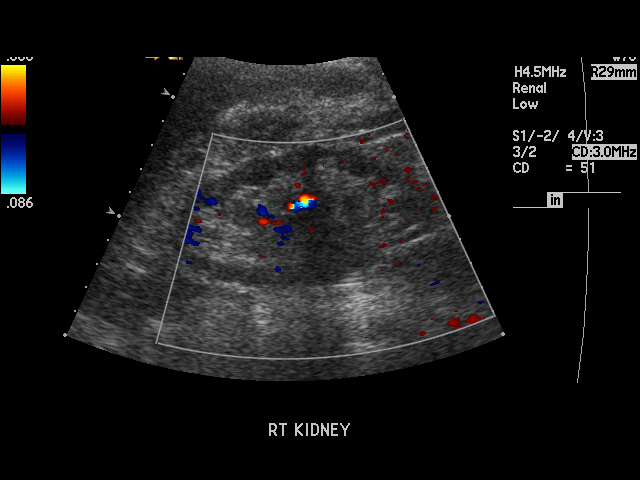
[im 10/28]
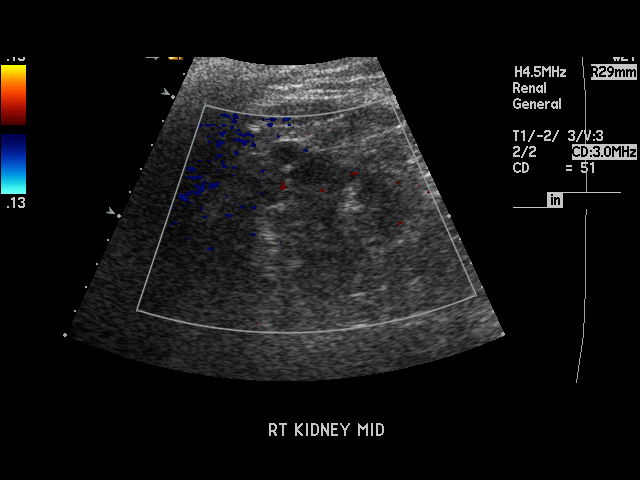
[im 11/28]
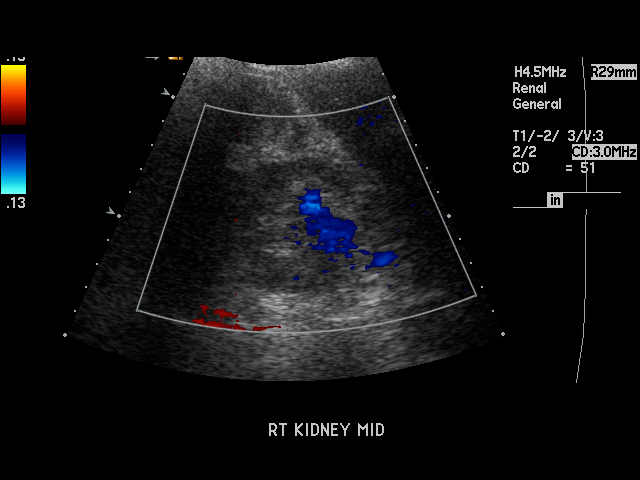
[im 13/28]
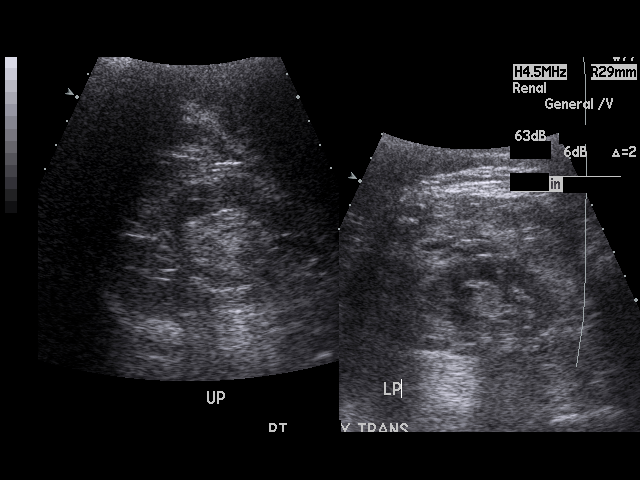
[im 14/28]
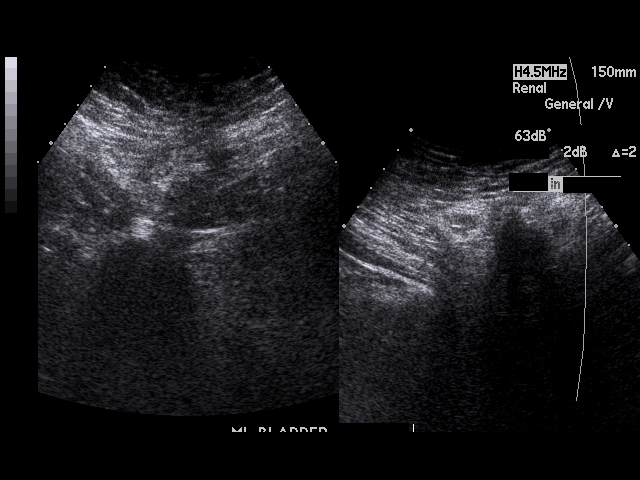
[im 15/28]
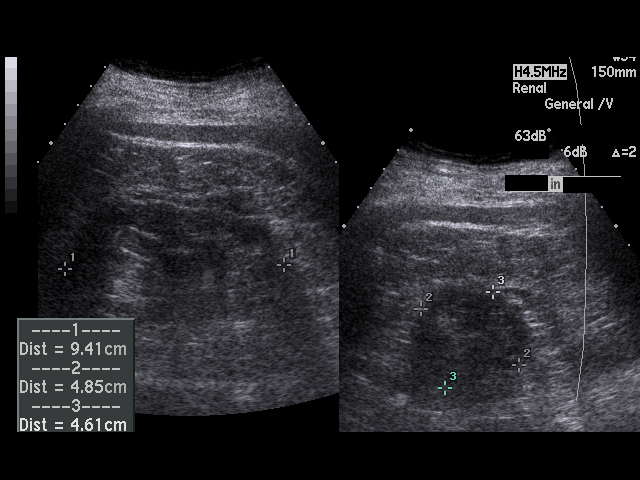
[im 17/28]
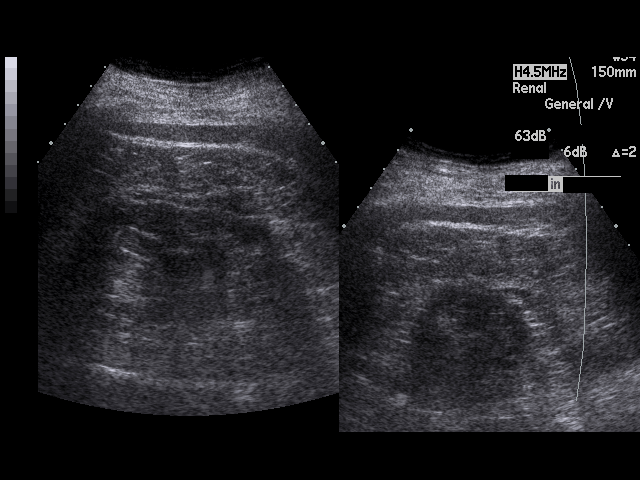
[im 19/28]
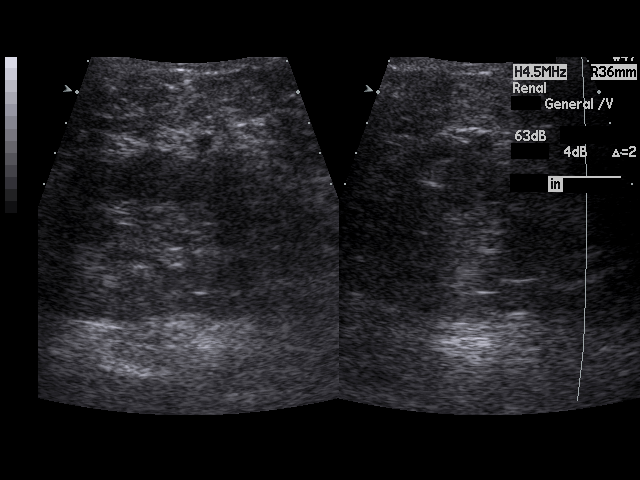
[im 21/28]
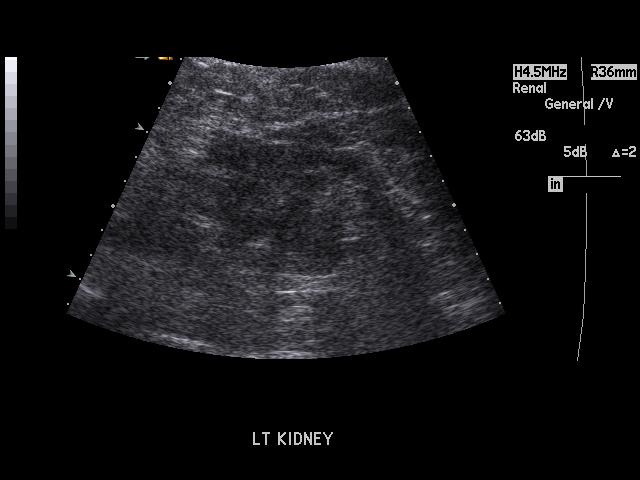
[im 22/28]
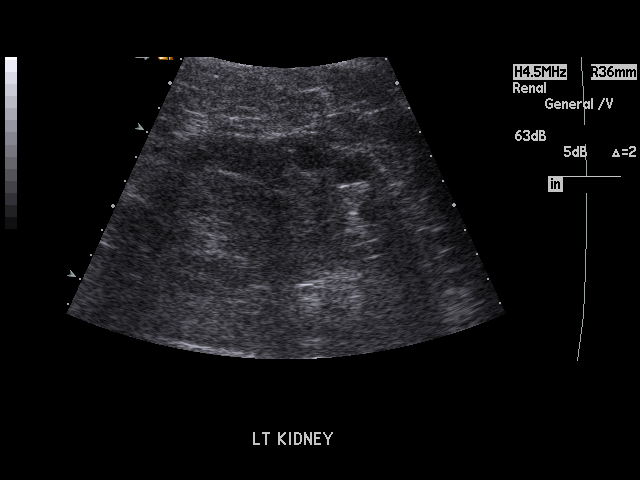
[im 24/28]
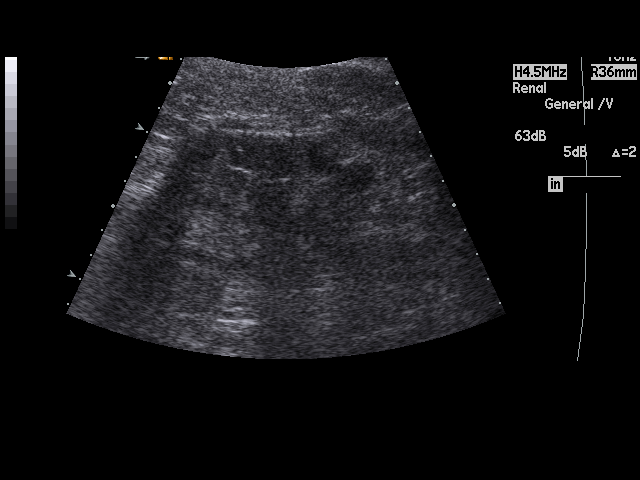
[im 25/28]
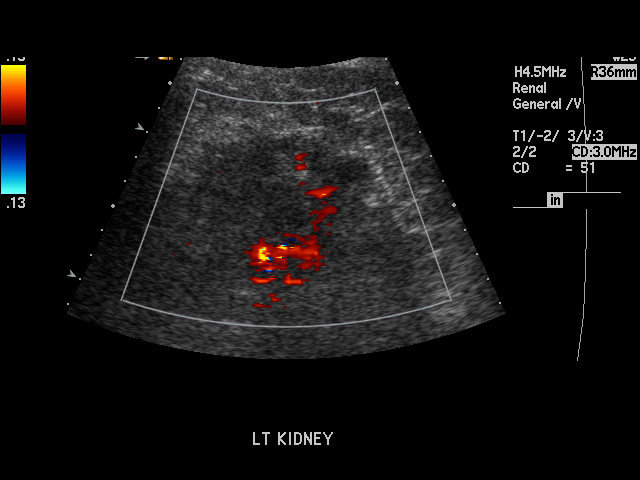
[im 28/28]
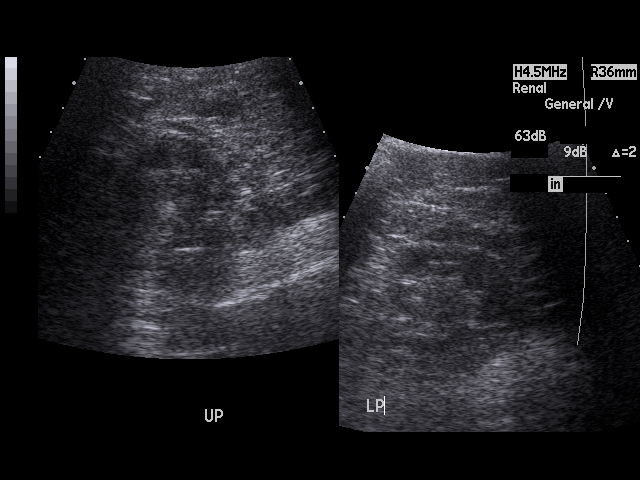

[17 of 25 positions shown; findings below may reference images not displayed]

PROCEDURE:     US  - US KIDNEY  - June 23, 2011  [DATE]

RESULT:     Sonogram of the kidneys demonstrates the right kidney measures
10.70 x 5.38 x 5.49 cm. There is a peripheral hypoechoic focus measuring
1.19 x 0.89 x 0.88 cm in the mid right kidney. The cortical thickness is
mm. The left kidney measures 9.41 x 4.85 x 4.61 cm. There is a small
exophytic area of decreased echogenicity measuring 0.54 x 0.50 x 0.50 cm.
Left renal cortical thickness is 0.82 cm. No stones are evident. The urinary
bladder contains no fluid. A Foley catheter is present.
IMPRESSION: 1. Cortical thinning in both kidneys.
2. Small cystic-appearing lesions in the midpole region of both kidneys.

## 2014-07-26 NOTE — Consult Note (Signed)
The patient was sleepingPhallus without bleeding.  Urine clearUrethral false passage status post cystoscopic catheter placementWould leave catheter indwelling 7-10 days.  Electronic Signatures: Riki AltesStoioff, Scott C (MD)  (Signed on 21-Mar-13 16:41)  Authored  Last Updated: 21-Mar-13 16:41 by Riki AltesStoioff, Scott C (MD)

## 2014-07-26 NOTE — Discharge Summary (Signed)
PATIENT NAME:  George Tate, [name] MR#:  [id number] DATE OF BIRTH:  07-Sep-1917  DATE OF ADMISSION:  08/09/2011 DATE OF DISCHARGE:  08/14/2011  DISCHARGE DIAGNOSES:  1. Left arm cellulitis, possibly secondary to methicillin-resistant Staphylococcus aureus.  2. Systemic inflammatory response syndrome secondary to #1.  3. Recurrent aspiration.  4. Chronic obstructive pulmonary disease with chronic respiratory failure.  5. Diabetes mellitus.  6. Chronic kidney disease, stage IV.  7. History of cerebrovascular accident with right-sided weakness.  8. Coronary artery disease.  9. Chronic anemia, likely anemia of chronic disease.  10. Benign prostatic hypertrophy.  11. Hypothyroidism.  12. Urinary retention with chronic Foley catheter.  13. Chronic right-sided systolic congestive heart failure.   HISTORY AND PHYSICAL: Please see dictated admission History and Physical.   SUMMARY OF HOSPITAL COURSE: The patient was admitted with evidence of systemic inflammatory response syndrome and required placement in the Intensive Care Unit. He was found to have severe cellulitis involving in the entire left upper extremity, including hand, wrist, and up to the shoulder. There were no open wounds to culture, but with his recent history of methicillin-resistant Staphylococcus aureus in urine, it was felt that this was likely the source; so he was placed on vancomycin with Zosyn added as well for further coverage. He showed improvement over the next 48 hours, was changed over to oral antibiotics, and remained afebrile on this. He has significant edema secondary to severe protein/calorie malnutrition and generalized debilitated state. He, in fact, has been bedbound for the last several months. He has had trouble with urinary retention, and a Foley catheter was placed under guidance by Urology here.   Hospice became involved, and Palliative Care consultation was obtained. Originally the plan had been to consider having him  go home with Hospice, however, he requires total care at this point, and physically it is concerning that his wife is simply not able to help him with this. Furthermore, he has periodic wheezing as he has trouble with aspiration of his saliva periodically; and it was felt that he would be better in an area where his symptoms can be better controlled and that he can be potentially transferred over to entirely comfort measures, if necessary. To this end, [facility]   PHYSICAL ACTIVITY: Up with assistance.   DIET: His diet will be mechanical soft with nectar-thick liquid.   OXYGEN: 2 liters nasal cannula continuously.   OTHER: The Foley was left in place secondary to his urinary retention.   CODE STATUS: Given his overall poor prognosis and DO NOT RESUSCITATE status, as well as the difficulty weighing him, the election has been made not to do daily weights or to contact a physician for weight gain. The family is comfortable with this plan. His condition at discharge is stable; however, his prognosis is poor.   DISCHARGE MEDICATIONS:  1. Roxanol 20 mg/mL, 0.25 to 0.5 mL p.o. or sublingually every 1 to 2 hours p.r.n. severe pain or dyspnea. The patient has a history of codeine intolerance, and the family is aware of potential for cross reaction.  2. Ranitidine 150 mg p.o. b.i.d.  3. ABHR suppository rectally every 4 to 6 hours p.r.n. nausea and vomiting.  4. Fingerstick blood sugars to be daily, with physician contacted for more than 200 and less than 70, and he may wind up needing sliding scale if these numbers rise.  5. Rythmol 225 mg p.o. b.i.d.  6. Trusopt 2%, 1 drop to each eye b.i.d.  7. Colace 100 mg p.o. b.i.d.  8. Symbicort 80/4.5, 2 puffs b.i.d.  9. Levothyroxine 0.05 mg p.o. daily.  10. Enteric-coated aspirin 81 mg p.o. daily. 11. Alphagan 0.15%, 1 drop to both eyes b.i.d.  12. Toprol-XL 50 mg p.o. daily.  13. Nystatin powder t.i.d. p.r.n. rash.  14. Doxycycline  100 mg p.o. b.i.d. x10 days.  15. Augmentin 500 mg p.o. b.i.d. x10 days.  16. Tramadol 50 mg p.o. every 6 hours p.r.n. pain not controlled with Tylenol.  17. DuoNebs small-volume nebulizer every 6 hours p.r.n. wheeze.  18. Lasix 40 mg p.o. daily p.r.n. edema.  19. Diaper dermatitis cream b.i.d. p.r.n. rash.   CODE STATUS: The patient is DO NOT RESUSCITATE. An out of facility DO NOT RESUSCITATE order will be sent with him.   ____________________________ Lynnea Ferrier, MD [id number] D: 08/14/2011 12:44:00 ET T: 08/14/2011 13:10:19 ET JOB#: [id number]  cc: Curtis Sites III, MD, <Dictator> Daniel Nones MD ELECTRONICALLY SIGNED 08/16/2011 7:30

## 2014-07-26 NOTE — Consult Note (Signed)
Impression: 81yo WM w/ h/o DM, CRI, CVA and hypothyroidism admitted with confusion, acute on chronic renal failure and new atrial fibrillation who has Methacillin Resistant Staph aureus in the urine and leukocytosis.  On admission, he had a u/a which showed blood and a negative urine culture.  Several days into his hospitalization, he had persistent leukocytosis so urine and blood cultures were obtained which grew Methacillin Resistant Staph aureus in the urine only.  It was obtained from a foley catheter.  He has had no fever during his hospitalization.  Review of his WBC going back to 03/2002 shows that his WBC has been elevated since that time.  There three readings in 2009 which were in the high normal range, but all others were high.  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  On the other hand, he has had foley manipulation of his bladder and acquired infection can not be ruled out. Agree with stopping the TMP/SMX due to the effects on the kidney.  I do not think that he needs IV therapy.  Will change his antibiotics to doxycycline.  This will have no effect on the kidneys. Would treat for 7 days. Continue contact isolation. While often Staph in the urine represents hematogenous spread, he had negative blood cultures.  No further work up is necessary. If he develops fever or chills, would consider repeat cultures. Would consider hematology consult, but as long as his WBC does not rise too high, therapy for CML would likely be observation.  Electronic Signatures: Kaitland Lewellyn, Rosalyn GessMichael E (MD)  (Signed on 27-Mar-13 13:06)  Authored  Last Updated: 27-Mar-13 13:06 by Sparrow [place], Rosalyn GessMichael E (MD)

## 2014-07-26 NOTE — Consult Note (Signed)
General Aspect the patient is a 81 year old male with history of diabetes, COPD, coronary atherosclerotic heart disease who was admitted after being noted by his wife to be minimally responsive. Patient is a. Difficult historian unable to answer any questions. The patient's wife gives this history. She states that apparently he was fairly responsive at home and able to speak in carry on a normal conversation. He has a history of coronary artery disease with prior myocardial infarction and percutaneous coronary intervention. He had recently been seen and crit on clinic for progressive Parkinson's disease, chronic kidney disease stage III, insulin requiring diabetes mellitus as well as congestive heart failure. The patient was noted to be severely hypoglycemic on arrival in the emergency room. Since admission he has been minimally responsive with the inability to communicate. He had been relatively hypotensive on presentation and is continued to be so since admission. He apparently has a history of atrial fibrillation and has been treated with Rythmol. He was not chronically anticoagulated do to concern over bleeding risk.   Physical Exam:   GEN disheveled    HEENT dry oral mucosa    NECK supple    RESP normal resp effort    CARD Irregular rate and rhythm  Murmur    Murmur Systolic    Systolic Murmur axilla    ABD normal BS    LYMPH negative neck    EXTR negative cyanosis/clubbing, negative edema    SKIN normal to palpation    NEURO cranial nerves intact, motor/sensory function intact    PSYCH lethargic   Review of Systems:   Subjective/Chief Complaint patient unable to give any history    ROS Pt not able to provide ROS    Medications/Allergies Reviewed Medications/Allergies reviewed     Pneumonia:    Anemia:    Renal Failure:    MRSA Sept 2009:    hyperthyroidism:    BPH:    parkinson's disease:    glaucoma:    hx arrhythmias:    hx MI with angioplasty:     COPD:    Hypercholesterolemia:    CHF:    HTN:    Diabetes:    lumbar laminectomy:    knee replacement:    TURP:   Home Medications: Medication Instructions Status  Rythmol SR 225 mg oral capsule, extended release 1 cap(s) orally every 12 hours Active  Toprol-XL 100 mg oral tablet, extended release 1 tab(s) orally once a day Active  Norvasc 5 mg oral tablet 1 tab(s) orally once a day Active  Lasix 80 mg oral tablet 1 tab(s) orally once a day Active  Lasix 40 mg oral tablet tab(s) orally  Active  Trusopt 2% ophthalmic solution drop(s) to each affected eye 2 times a day Active  insulin aspart  subcutaneous  Active  insulin aspart  subcutaneous  Active  Levoxyl 100 mcg (0.1 mg) oral tablet 1 tab(s) orally once a day Active  [employer] Stool Softener 100 mg oral capsule 1 cap(s) orally 2 times a day Active  Dulcolax Laxative 5 mg oral delayed release tablet 1 tab(s) orally once a day Active  Tylenol 325 mg oral tablet 2 tab(s) orally every 4 hours Active  Centrum Flavor Burst oral tablet, chewable 1 tab(s) orally once a day Active  Symbicort 80 mcg-4.5 mcg/inh inhalation aerosol 2 puff(s) inhaled 2 times a day Active   EKG:   Interpretation atrial fibrillation with variable ventricular response    Codeine: N/V/Diarrhea  Propine: Other    Impression  81 year old male with history of diabetes, COPD, coronary disease, apparent atrial fibrillation on Rythmol who is admitted with altered mental status. He was noted to be severely hypoglycemic on admission as well as hypotensive. He was given D50 with some improvement in his glucose with with minimal improvement in his symptoms and sensorium. The patient's wife who was also somewhat difficult historian states that the patient is been fairly conversant at home up until the date of the event. He had been treated for progressive Parkinson's disease, chronic kidney disease stage III as well as insulin-dependent diabetes, COPD with chronic  respiratory failure as an outpatient. Chest x-ray did not reveal significant pulmonary edema or other acute pulmonary disease. Head CT was unremarkable. Etiology of his encephalopathy appears to be multifactorial to include profound hypoglycemia. He had been hypotensive likely secondary to volume depletion. He does not appear to be in congestive heart failure at present. He appears to have had atrial fibrillation in the past based on his medications however history of this is difficult to document. He is clearly not a candidate for anticoagulation at present. He appeared to be tolerating the Rythmol at present time however we'll need to consider discontinuing in essence he is currently in atrial fibrillation.    Plan 1. Continue to gently hydrate following renal function and exam 2. Agree with holding antihypertensive medications at this time as the patient is relatively hypotensive and possibly volume depleted 3. Treatment of his hypoglycemia 4. Consideration for neurologic evaluation given his continued altered mental status after his hypoglycemia has been corrected 5. Not a candidate for chronic anticoagulation for his atrial fibrillation. Will consider continuation of Rythmol versus discontinuation given his atrial fibrillation   Electronic Signatures: Dalia Heading (MD)  (Signed 19-Mar-13 22:14)  Authored: General Aspect/Present Illness, History and Physical Exam, Review of System, Past Medical History, Home Medications, EKG , Allergies, Impression/Plan   Last Updated: 19-Mar-13 22:14 by Dalia Heading (MD)

## 2014-07-26 NOTE — Consult Note (Signed)
He has been previously seen for difficult catheter placement and a urethral false passage.  He was recently admitted and his indwelling catheter was removed by the emergency department and replaced with a standard catheter and no urine output obtained.  Was asked to see for catheter placement. There has been some distal glanular erosion secondary to the indwelling Foley.  The external genitalia were prepped and draped.  An 7118 French coud catheter was placed into the bladder without problems. Urethral false passage.  If catheter requires replacement a coud catheter goes easily into the bladder.  He does have glanular erosion and chronic retention.  May do better with a suprapubic tube in the future.   Electronic Signatures: Riki AltesStoioff, Quantel Mcinturff C (MD)  (Signed on 09-May-13 17:40)  Authored  Last Updated: 09-May-13 17:40 by Riki AltesStoioff, Annaliah Rivenbark C (MD)

## 2014-07-26 NOTE — Consult Note (Signed)
PATIENT NAME:  George, Tate MR#:  [id number] DATE OF BIRTH:  03-28-1918  DATE OF CONSULTATION:  06/28/2011  REFERRING PHYSICIAN:  Daniel Nones, MD CONSULTING PHYSICIAN:  Rosalyn Gess. Tayson Schnelle, MD  REASON FOR CONSULTATION: MRSA in the urine.   HISTORY OF PRESENT ILLNESS: The patient is a 81 year old white man with a past history significant for diabetes, chronic renal insufficiency, stroke, and hypothyroidism who was admitted on 06/20/2011 with mental status changes. The patient is currently unable to provide any history and the history was obtained from the chart. He was seen and examined immediately after having a suprapubic catheter placed. Apparently the patient had been found by his wife not breathing well and very lethargic. EMS was called and he was found to have a blood sugar of 45. He was given D50 and brought to the ER. He remained confused in the emergency room and was subsequently admitted. He was initially hypotensive with atrial fibrillation and tachycardia. His renal function was found to be worse than his baseline. On admission he had a Foley catheter placed and his initial urinalysis had significant amounts of blood present. A urine culture was negative. His white count on admission was 15.3 and has remained elevated. He has not been febrile. However, due to his white count elevation, repeat urinalysis and culture was obtained as well as blood cultures on 06/23/2011. While the blood cultures were negative the urine culture grew MRSA. He was initially given levofloxacin, based on the urinalysis. When the cultures return, he was started on trimethoprim sulfamethoxazole. Today this was switched to vancomycin. The patient has had no fever during his entire hospital course. He is unable to provide any history due to his persistent confusion. He was evaluated by urology who found that he had a false passage in the urethra. He underwent cystoscopy and placement of a Foley catheter. Today the catheter  was replaced with a suprapubic catheter. Per the nurse in the recovery room, he had a significant amount of urine still present.   ALLERGIES: Codeine and Propine.   PAST MEDICAL HISTORY:  1. Chronic renal insufficiency.  2. Diabetes.  3. Chronic obstructive pulmonary disease.  4. Congestive heart failure.  5. Stroke with right-sided weakness.  6. Coronary artery disease.  7. Benign prostatic hypertrophy.  8. Hypothyroidism.  9. Status post bilateral knee replacements.  10. Status post lumbar laminectomy.   SOCIAL HISTORY: The patient apparently lives with his wife. He does not smoke nor does he drink.   FAMILY HISTORY: Positive for diabetes in his mother.   REVIEW OF SYSTEMS: Unable to obtain from the patient due to his confusion.   PHYSICAL EXAMINATION:   VITAL SIGNS: T-max 98.8, T-current 96.6, pulse 73, blood pressure 134/79, 92% on 2 liters.   GENERAL: A 81 year old white man confused but in no acute distress.   HEENT: Normocephalic, atraumatic. Pupils equal and reactive to light. Extraocular motion intact. Sclerae, conjunctivae, and lids are without evidence for emboli or petechiae. Oropharynx showed gums are in fair condition.   NECK: Supple. Full range of motion. Midline trachea. No lymphadenopathy. No thyromegaly.   LUNGS: Clear to auscultation bilaterally with good air movement. No focal consolidation.   HEART: Regular rate and rhythm without murmur, rub, or gallop.   ABDOMEN: Soft, nontender, and nondistended. No hepatosplenomegaly. No hernia is noted. A suprapubic catheter was in place.   EXTREMITIES: No evidence for tenosynovitis.   NEURO: The patient was confused. He was able to say that he was not hurting currently,  but could not provide any history of what led him up to being hospitalized. It was difficult to assess further neurologic evaluation.   PSYCHIATRIC: Unable to assess due to his current confusion.   LABS/STUDIES: BUN 80, creatinine 3.07,  bicarbonate 23, and anion gap 9. White count 21.1, hemoglobin 11.6, platelet count 370, and ANC 18.5. Review of his white counts dating back to December of 2003 show elevations anywhere from the mid teens to the high 20s. There were only three occasions over the course of the last 10 years where his white count was in the normal range, the most recent one being 09/07/2007 when it was 9.9. The following day it was 19.3 and no further normal values have been recorded.   Blood cultures from 06/23/2011 show no growth. A urinalysis from 06/23/2011 shows negative nitrites, 3+ leukocyte esterase, 39 red cells, 137 white cells, and 2+ blood, with a urine culture growing MRSA. A urinalysis from admission had 45,000 red cells and 27 white cells noted. Urine cultures and blood cultures at that time where negative.   Chest x-ray from admission shows mild interstitial edema.   A CT scan of the head without contrast showed no acute process.   Renal ultrasound showed cortical thinning of both kidneys and small cystic appearing lesions in the midpole region of both kidneys.   IMPRESSION: A 81 year old white man with a past history significant for diabetes, chronic renal insufficiency, stroke, and hypothyroidism who was admitted with confusion, acute on chronic renal failure, and new atrial fibrillation who has methicillin resistant Staphylococcus aureus in the urine and leukocytosis.   RECOMMENDATIONS:  1. On admission he had a urinalysis which showed blood and a negative urine culture. Several days into his hospitalization he had persistent leukocytosis so urine and blood cultures were obtained which grew MRSA in the urine only. It was obtained from a Foley catheter. He has had no fever during this hospitalization. Review of his white count going back to December 2003 showed that his white count has been elevated since that time. There were three readings in 2009 which were in the high normal range, but all others  were high. 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 On the other hand, he has had Foley manipulation of his bladder and an acquired infection cannot be ruled out.  2. I agree with stopping the trimethoprim-sulfamethoxazole due to the effects on the kidneys. I do not think that he needs IV therapy, however. We will change his antibiotics to doxycycline. This will have no effect on the kidneys.  3. Would treat for seven days.  4. Would continue contact isolation.  5. While often staph in the urine represents hematogenous spread through the blood, he had negative blood cultures. No further work-up is necessary.  6. If he develops fevers or chills would consider repeat cultures.  7. Would consider hematology consultation, but as long as his white count does not rise too high therapy for CML would likely simply be observation.   This is a moderately complex infectious disease case. Thank you very much for involving me in George Tate's care.  ____________________________ Rosalyn GessMichael E. Judge Duque, MD meb:slb D: 06/28/2011 16:39:49 ET T: 06/29/2011 08:44:19 ET JOB#: [id number]  cc: Rosalyn GessMichael E. Shauntee Karp, MD, <Dictator> Tariana Moldovan E Gleason Ardoin MD ELECTRONICALLY SIGNED 07/06/2011 13:16

## 2014-07-26 NOTE — H&P (Signed)
PATIENT NAME:  George Tate, ANSTEAD MR#:  [id number] DATE OF BIRTH:  [birth year]  DATE OF ADMISSION:  06/20/2011   PRIMARY CARE PHYSICIAN: Daniel Nones, MD   REQUESTING PHYSICIAN: Dr. Mindi Junker   CHIEF COMPLAINT: Altered mental status.   HISTORY OF PRESENT ILLNESS: The patient is a 81 year old male with a known history of diabetes, chronic obstructive pulmonary disease, congestive heart failure. He is being admitted for suspected encephalopathy. The patient was found in bed by his wife, was not breathing well and was very sleepy and close to unresponsive as per the patient's wife. She called EMS who checked his blood sugar and was found to be 45. He was given an amp of D50 and was brought in to the Emergency Department. While in the ED he was alert to name only, kept repeating what are we doing over and over. He was complaining of right shoulder pain and had several bruises all over his body. As per his wife, the patient has eczema and keeps scratching at times and makes him bleed. While in the ED the patient is still quite sleepy and is unable to provide much history. He will wake up to strong verbal stimulus and deep pinching. While in the ER his blood pressure was 90/55 and his heart rate was 108 per minute. While in the ER the patient was found to be in new onset atrial fibrillation with a rate anywhere from 90 to 110. He still remained quite confused on my evaluation. His blood sugar, last one, was 68 and he was also found to have acute on chronic kidney disease with a creatinine of 2.95. He is being admitted for further evaluation and management.   PAST MEDICAL HISTORY:  1. CKD, stage IV, with a baseline creatinine of 2.1.  2. Diabetes, insulin requiring. 3. Chronic obstructive pulmonary disease with chronic respiratory failure.  4. Chronic systolic congestive heart failure.  5. History of cerebrovascular accident with right-sided weakness.  6. Coronary artery disease.  7. Anemia.  8. Glaucoma.   9. Benign prostatic hypertrophy with prior TURP.  10. Hypothyroidism.   PAST SURGICAL HISTORY:  1. Bilateral knee replacement.  2. Lumbar laminectomy.  SOCIAL HISTORY: Negative for tobacco and alcohol.   FAMILY HISTORY: Positive for diabetes in the mother.   MEDICATIONS AT HOME:  1. Tylenol 650 mg p.o. every four hours as needed.  2. Trusopt 2% eyedrops to each affected eye twice a day.  3. Toprol-XL 100 mg p.o. daily.  4. Symbicort 2 puffs inhaled twice a day.  5. Rythmol 225 mg p.o. b.i.d.  6. Stool softener 100 mg p.o. b.i.d.  7. Norvasc 5 mg p.o. daily.  8. Levoxyl 100 mcg p.o. daily.  9. Lasix 40 mg p.o. daily.  10. Dulcolax 5 mg p.o. daily.  11. Centrum Silver once a day.  12. Insulin 70/30 32 units in the morning, 22 units in the evening.   REVIEW OF SYSTEMS: Unable to obtain as the patient is quite confused and sleepy.   PHYSICAL EXAMINATION:   VITAL SIGNS: Temperature 98, heart rate 110 per minute, respirations 22 per minute, blood pressure early on in the ER was 90/55, now it is 153/83 mmHg. He is saturating 99% on 4 liters oxygen via nasal cannula.   GENERAL: The patient is a 81 year old male lying in the bed with quite altered mental status, unable to wake up, will open eyes but would not communicate much.   EYES: Pupils equal, round, reactive to light and accommodation. No scleral  icterus. Extraocular muscles intact.   HEENT: Head atraumatic, normocephalic. Oropharynx and nasopharynx clear.   NECK: Supple. No jugular venous distention. No thyroid enlargement or tenderness.   LUNGS: Decreased breath sounds at the bases bilaterally. No wheezing, rales, rhonchi, or crepitation.   CARDIOVASCULAR: Irregular irregular heart rate. Systolic ejection murmur present.   ABDOMEN: Soft, nontender, nondistended. Bowel sounds present. No organomegaly or mass.   EXTREMITIES: No pedal edema, cyanosis, or clubbing.   NEUROLOGIC: Nonfocal examination. He does have residual  right-sided weakness from previous CVA. Cranial nerves III to XII difficult to evaluate but seems intact. He is quite sleepy and wakes up on deep painful stimuli but otherwise intermittently he will open the eyes and follows with eyes.   PSYCHIATRIC: Unable to assess secondary to the patient's mental status.    SKIN: The patient does have ecchymosis all over both hands.   LABORATORY, DIAGNOSTIC, AND RADIOLOGICAL DATA: Normal BMP except BUN of 55, creatinine 2.95. Normal liver function tests. Normal first of cardiac enzymes. TSH was 0.010. CBC showed white count of 15.3, hemoglobin 12.4, hematocrit 39, platelets 232. PT 13.4. INR 1.0.   CT scan of the head without contrast while in the ER showed no acute intracranial process. CT scan of the cervical spine showed no acute osseous injury. Chest x-ray showed stable cardiomegaly. Mild interstitial edema. Right shoulder x-ray showed no acute bony abnormality. Degenerative joint disease present.   EKG showed atrial fibrillation with left bundle branch block, LVH also present, rate of 84 per minute.   IMPRESSION AND PLAN:  1. Encephalopathy likely multifactorial with hypoglycemia, could be medication including some sleeping pill (benzodiazepine) although I do not see it listed in his current medications. He was on Xanax on last admission so I am not sure whether he still has some leftover medication from there. Will monitor him on telemetry. Will get physical therapy evaluation and management to evaluate him.  2. New onset atrial fibrillation. The patient uses Rythmol at home. Will continue same. Will consult Cardiology. Will obtain serial cardiac enzymes. Obtain 2-D echocardiogram. Cannot use digoxin due to acute renal failure. Will give Cardizem x1 and resume all his home medication, although early on his blood pressure was low, now it's slowly getting better. Will resume Toprol also.  3. Acute on chronic kidney disease with a baseline creatinine of 2.1.  Will avoid nephrotoxin. Will hydrate him and monitor his kidney function. Foley catheter has been placed early on. It was not in the right place and has been readjusted, now has been draining, has drained about 400 mL urine. Will hold Lasix for now.  4. New onset atrial fibrillation. His rate is controlled. Will resume his home medication including Rythmol and metoprolol. Will monitor him on telemetry. Consult Cardiology. He is not a good candidate for anticoagulation due to high risk of fall. 5. Hypotension. Could be due to dehydration. Will monitor him with IV fluids. Hold his Lasix and resume his blood pressure medication as his blood pressure tolerates.  6. Hypoglycemia. Will hold all his diabetes medication including insulin. Start him on sliding scale insulin. Check his blood sugar every four hours.    CODE STATUS: DO NOT RESUSCITATE.   TOTAL TIME TAKING CARE OF THIS PATIENT: 55 minutes.   ____________________________ Ellamae SiaVipul S. Sherryll BurgerShah, MD vss:drc D: 06/20/2011 17:57:48 ET T: 06/21/2011 06:15:54 ET JOB#: [id number]  cc: Joseph Bias S. Sherryll BurgerShah, MD, <Dictator> Lynnea FerrierBert J. Klein III, MD Ellamae SiaVIPUL S [facility] MD ELECTRONICALLY SIGNED 06/21/2011 21:58

## 2014-07-26 NOTE — Op Note (Signed)
PATIENT NAME:  George Tate, Delynn C MR#:  [id number] DATE OF BIRTH:  [birth year]  DATE OF PROCEDURE:  06/20/2011  PREOPERATIVE DIAGNOSES:  1. Inability to place Foley catheter. 2. Urinary retention.   POSTOPERATIVE DIAGNOSIS: Urethral false passage.  PROCEDURE: Cystoscopy with catheter placement over a wire.   SURGEON: Scott C. Lonna CobbStoioff, MD   ANESTHETIC: Local.  INDICATION: Refer to the consultation note dated 06/20/2011.   DESCRIPTION OF PROCEDURE: Consent was obtained by the patient's wife. The patient was prepped and draped sterilely. A flexible cystoscope was passed at the bedside under direct vision. In the bulbar urethra a false passage was noted. The scope was advanced more cephalad into the urethral opening and easily advanced into the bladder. A 0.035 guidewire was then placed through the cystoscope. It coiled in the bladder and the cystoscope was removed. A 15 [place] catheter was placed over the guidewire without difficulty. There was prompt drainage of blood-tinged urine. Catheter was placed to gravity drainage.   IMPRESSION: Urethral false passage.   RECOMMENDATION: I would leave the catheter indwelling at least 7 to 10 days to allow for healing.   ____________________________ Verna CzechScott C. Lonna CobbStoioff, MD scs:drc D: 06/21/2011 08:36:03 ET T: 06/21/2011 11:13:06 ET JOB#: [id number]  cc: Lorin PicketScott C. Lonna CobbStoioff, MD, <Dictator> Riki AltesSCOTT C STOIOFF MD ELECTRONICALLY SIGNED 07/05/2011 12:01

## 2014-07-26 NOTE — Consult Note (Signed)
Suprapubic tube over the weekend was noted not to be draining.  He was seen by Dr. Richardson LandryHouser who was able to place a coud catheter.  1200 mL of urine was obtained. 1. Urinary retention  2.  Suprapubic tube appears to be out of the bladder.Would leave Foley catheter in 2-3 weeks.  Will have radiology remove suprapubic tube  Electronic Signatures: Riki AltesStoioff, Zian Mohamed C (MD)  (Signed on 01-Apr-13 17:20)  Authored  Last Updated: 01-Apr-13 17:20 by Riki AltesStoioff, Shaliah Wann C (MD)

## 2014-07-26 NOTE — Discharge Summary (Signed)
PATIENT NAME:  George Tate, George Tate MR#:  [id number] DATE OF BIRTH:  1917-06-27  DATE OF ADMISSION:  06/20/2011 DATE OF DISCHARGE:  07/05/2011  FINAL DIAGNOSES:  1. Diabetes mellitus, uncontrolled.  2. Acute exacerbation of chronic obstructive pulmonary disease.  3. Acute respiratory failure secondary to chronic obstructive pulmonary disease exacerbation.  4. Acute renal failure secondary to acute tubular necrosis.  5. Dehydration.  6. Urinary retention, requiring suprapubic catheter placement, subsequently had Foley catheter replaced.  7. History of cerebrovascular accident with hemiparesis.  8. Chronic right-sided systolic congestive heart failure.  9. Atrial fibrillation.  10. Chronic kidney disease stage IV.  11. Chronic dysphagia secondary previous stroke.  12. Persistent leukocytosis.  13. Methicillin-resistant Staphylococcus aureus urinary tract infection.   HISTORY AND PHYSICAL: Please see dictated admission history and physical.   [place]: Patient was admitted with altered mental status, found to have profound hypoglycemia, also found to have acute renal failure secondary to dehydration, as well as acute respiratory failure secondary to acute exacerbation of chronic obstructive pulmonary disease. He was placed on steroids, fluids, with some slight improvement but continued aggressive treatment for his lungs. This was concern about worsening of his congestive heart failure as well. Nephrology saw the patient, with medications adjusted for this. He was found to have evidence of urinary retention, and there was difficulty placing the Foley catheter. Foley was removed after eight days, however, he was not able to void, Foley was attempted to be replaced, however, it was thought this might have gone into a false passage. Urology saw him, and he underwent suprapubic catheter placement by CT scan. Again, this did not appear to be draining well, and so he had a Foley replaced, with  good urinary output. At this point this is to be left in place and he will follow up with urology. He was found to have methicillin-resistant staph urinary tract infection and was placed on antibiotics for this. He was evaluated by infectious disease, and change over to doxycycline, which he tolerated well.   He has significant disability in terms of his physical mobility at baseline, and this deteriorated with his multiple medical problems. It became clear that he was not going to be able to return home, although he was somewhat unrealistic about this. After further discussing with the patient he was willing to go for rehabilitation, at this point he will be discharged to a nursing facility in stable condition with his physical activity up with assistance with a walker as tolerated. He will be on fall and aspiration precautions. He will need oxygen 2 liters nasal cannula continuously. He will need fingerstick blood sugars done with each meal and at bedtime. His Foley will remain in place to gravity drainage. His diet will be no added salt, no concentrated sweets, and he should have chopped foods with nectar thick liquids. He should be weighed daily, with physician called for more than 2 pound gain in one day or 5 pounds in one week, or increasing signs, symptoms of congestive heart failure. Physical therapy, occupational therapy, and speech therapy will evaluate and treat the patient. MET-B, CBC, and urinalysis performed in one week with results to nursing home physician. He was maintained on beta blockers. He is not a candidate for ACE inhibitors or angiotensin receptor blockers secondary to his renal failure.   DISCHARGE MEDICATIONS:  1. Regular sliding scale insulin.  2. Lipitor 20 mg p.o. at bedtime. 3. Symbicort 160/4.5 mcg, 2 puffs b.i.d.  4. Omeprazole 20  mg p.o. daily for gastroesophageal reflux disease.  5. Alphagan 0.15%, one drop to both eyes b.i.d. for glaucoma.  6. Trusopt 2% one drop to both  eyes b.i.d. for glaucoma.  7. Rythmol 225 mg p.o. b.i.d. for paroxysmal atrial fibrillation.  8. Insulin 70/30 12 units subcutaneous in a.m., 8 units subcutaneous in p.m.  9. Toprol-XL 50 mg p.o. daily.  10. Synthroid 0.05 mg p.o. daily for hypothyroidism.  11. Doxycycline 100 mg p.o. b.i.d. x10 days.  12. DuoNeb SVN 4 times a day while awake.  13. Colace 100 mg p.o. b.i.d., to be held for loose stools.  14. Enteric-coated aspirin 81 mg p.o. daily.   NOTE: Despite his elevated [place] score, he was not felt to be a candidate for full anticoagulation secondary to high fall risk, recent suprapubic Foley catheter placement, and his renal failure. The risks/benefits of this were discussed with the patient and family members best as could be performed, and they are comfortable with this.    CODE STATUS: Patient is DO NOT RESUSCITATE an out of facility DO NOT RESUSCITATE order will be sent with him to the facility.   ____________________________ Adin Hector, MD bjk:cms D: 07/05/2011 08:28:16 ET T: 07/05/2011 08:41:00 ET JOB#: [id number]  cc: Adin Hector, MD, <Dictator> Ramonita Lab MD ELECTRONICALLY SIGNED 07/06/2011 20:24

## 2014-07-26 NOTE — Consult Note (Signed)
Foley removed yesterday and unable to voided. Catheter reinserted by nursing staff and no urine obtaned- Bladder scan > 900 mL Imp: urinary retention/ urethral false passage Recc: He will need longer term drainage and recc sp tube placement.  Discussed w/ wife.  Discussed with Dr. Allena KatzPatel  Electronic Signatures: Riki AltesStoioff, Loys Shugars C (MD)  (Signed on 27-Mar-13 10:04)  Authored  Last Updated: 27-Mar-13 10:04 by Riki AltesStoioff, Maisee Vollman C (MD)

## 2014-07-26 NOTE — Consult Note (Signed)
PATIENT NAME:  George MarinBASON, Deryk C MR#:  [id number] DATE OF BIRTH:  12-01-1917  DATE OF CONSULTATION:  07/01/2011  REFERRING PHYSICIAN:   CONSULTING PHYSICIAN:  Tawni MillersEdward R. Richardson LandryHouser, II, MD  IMPRESSION: Patient is a 81 year old white male with history of diabetes, chronic renal insufficiency and hypothyroidism who now has got acute on chronic renal failure and methicillin-resistant Staphylococcus aureus of the urine. He is being treated with doxycycline for this and has been followed by urology, Dr. Lonna CobbStoioff, during this admission. Initially patient had to undergo cystoscopy with catheter placement over wire by Dr. Lonna CobbStoioff on the 19th for urethral false passage. Subsequently patient had a small suprapubic tube placed a few days ago and nursing called urology on-call tonight because apparently patient had overflow incontinence PVR of 800 and virtually no urine output through the suprapubic tube catheter for the past 48 hours. I came to the bedside. Due to the patient's history of suprapubic catheter and requiring cystoscopy secondary to urethral false passage I did not want the nurse to attempt to place a Foley and incur more trauma on the patient, however, after discussing it with the wife options being at this time when I evaluated the SP tube.  I found a small 8.5 French suprapubic tube not to be able to be irrigated or aspirated with attempt. I discussed with the wife an attempt at placing a urethral Foley catheter gently. If this failed considering repeat the cystoscopy with Foley placement as opposed to trying to dilate is suprapubic tube tract over a wire here at the bedside. Patient and wife agreed to attempt at placement of a Foley catheter and after instilling using copious amount of lubrication I was able to place an 6018 [place]French Coud catheter with minimal resistance and placed 10 mL sterile water in the balloon. This immediately drained close to 1200 mL of amber urine. At this point in time I placed to gravity  bag drainage and called the on-call physician for the patient's primary care team. We discussed monitoring the patient for postobstructive diuresis and leaving his Foley catheter in to gravity bag drainage as well as leaving the suprapubic tube catheter in place in case primary team and patient's primary urologist deem it necessary to switch him back to a suprapubic tube catheter, he would already have access for them to replace a suprapubic tube in the future.   PLAN:  1. Foley catheter to gravity bag drainage.  2. Monitor lytes for postobstructive diuresis. Discussed with primary team.  3. He should follow up with Dr. Lonna CobbStoioff as an outpatient once acute issues resolve.     4. Antibiotics per primary team.   ____________________________ Tawni MillersEdward R. Weldon InchesHouser, II, MD erh:cms D: 07/01/2011 23:18:00 ET T: 07/02/2011 11:32:48 ET JOB#: [id number]  cc: Tawni MillersEdward R. Weldon InchesHouser, II, MD, <Dictator> Beaulah CorinEDWARD R Amand Lemoine MD ELECTRONICALLY SIGNED 07/10/2011 9:54

## 2014-07-26 NOTE — Consult Note (Signed)
PATIENT NAME:  George Tate, George Tate MR#:  [id number] DATE OF BIRTH:  1918/01/29  DATE OF CONSULTATION:  06/23/2011  REFERRING PHYSICIAN:  Ramonita Lab, MD CONSULTING PHYSICIAN:  Duru Reiger Lilian Kapur, MD  REASON FOR CONSULTATION: Acute renal failure in the setting of chronic kidney disease stage IV.   HISTORY OF PRESENT ILLNESS: The patient is a pleasant 81 year old Caucasian male with past medical history of diabetes mellitus, chronic obstructive pulmonary disease, congestive heart failure with normal ejection fraction of 50% this admission, cerebrovascular accident with right-sided weakness, coronary artery disease, anemia of chronic kidney disease, benign prostatic hypertrophy status post TURP, hypothyroidism, glaucoma, bilateral knee replacement, and lumbar laminectomy who presented to [facility] on 06/20/2011 with altered mental status. It appears that upon initial presentation, the patient had hypoglycemia as a cause of his altered mental status. He was given an amp of D50 through EMS. He was found to be hypotensive in the Emergency Department as well. The patient was also found to have acute renal failure upon initial presentation here. The patient's baseline creatinine is 2.1 with an EGFR ranging from 28 to 30. However, currently his creatinine has risen to 3.32 with an EGFR of 19. The patient had a renal ultrasound today which was negative for obstruction. Previously, there was some concern for urinary retention; however, Dr. Bernardo Heater placed a Foley catheter with cystoscopic assistance. SPEP and UPEP have been ordered by Dr. Caryl Comes this admission. He had these in 10/2010 which were found to be negative. The patient was found to have very dry oral mucosa on exam.   PAST MEDICAL HISTORY:  1. Diabetes mellitus.  2. Chronic obstructive pulmonary disease.  3. History of congestive heart failure with a normal ejection fraction this admission.  4. Cerebrovascular accident with residual  right-sided weakness.  5. Anemia of chronic kidney disease.  6. Chronic kidney disease, stage III-IV with baseline creatinine of 2.1.  7. Glaucoma.  8. Benign prostatic hypertrophy status post TURP.  9. Hypothyroidism.   PAST SURGICAL HISTORY:  1. Bilateral knee replacement.  2. Lumbar laminectomy.  3. TURP.   ALLERGIES: Codeine and Propine.  CURRENT INPATIENT MEDICATIONS:  1. Tylenol 650 mg p.o. every four hours p.r.n.  2. Aspirin 81 mg daily.  3. Lipitor 20 mg p.o. daily.  4. Brimonidine 0.5% one drop both eyes b.i.d.  5. Symbicort 160/4.5, two puffs inhaled b.i.d.  6. Dorzolamide one drop both eyes twice a day.  7. Sliding scale insulin.  8. Omeprazole 20 mg p.o. every a.m.  9. Zofran 4 mg IV every four hours p.r.n.  10. Haldol 1 to 2 mg IV every 2 to 4 hours p.r.n. agitation.  11. Propafenone 225 mg p.o. b.i.d.  12. Insulin 70/30, ten units subcutaneous before supper.  13. Metoprolol 50 mg p.o. daily.  14. Insulin 70/30 subcutaneous 15 units before breakfast.  15. Levofloxacin 250 mg p.o. daily.   SOCIAL HISTORY: The patient is married. He is a retired Development worker, community. There was a long-standing history of tobacco abuse, but he states that he quit in 1975. Denies alcohol or illicit drug use.   FAMILY HISTORY: The patient's mother died of heart attack. The patient's father apparently had uremic poisoning per his report.   REVIEW OF SYSTEMS: CONSTITUTIONAL: Currently denies fevers, chills or weight loss. EYES: Has history of glaucoma. HEENT: Denies headaches, hearing loss, tinnitus, epistaxis. PULMONARY: Reports cough as well as shortness of breath. CARDIOVASCULAR: Denies chest pains or palpitations. Had history of atrial fibrillation and currently on Rythmol  for this. GASTROINTESTINAL: Reports some mild nausea and poor appetite. GU: Has history of benign prostatic hypertrophy and had difficult Foley placement this admission. NEUROLOGIC: Denies numbness, has residual right-sided weakness.  MUSCULOSKELETAL: Denies joint pain, swelling, or redness. ENDOCRINE: Denies polyuria or polydipsia. Has history of diabetes mellitus. HEMATOLOGIC/LYMPHATIC: Has history of anemia of chronic kidney disease. INTEGUMENTARY: The patient has a history of eczema. PSYCHIATRIC: Denies depression or bipolar disorder. ALLERGY/IMMUNOLOGIC: Denies seasonal allergies or history of immunodeficiency.   PHYSICAL EXAMINATION:  VITAL SIGNS: Temperature 97.3, pulse 52, respirations 20, blood pressure 117/77, pulse ox 95% on 3 liters.   GENERAL: An elderly, frail Caucasian male, currently in no acute distress.   HEENT: Normocephalic, atraumatic. Extraocular movements are intact. Pupils equal, round, and reactive to light. No scleral icterus. Conjunctivae are pink. No epistaxis noted. Gross hearing intact. Oral mucosa are very dry.   NECK: Supple without JVD or lymphadenopathy.   LUNGS: Lungs demonstrate extensive bilateral wheezing with normal respiratory effort currently.   HEART: S1 and S2, 2/6 systolic ejection murmur heard. No pericardial friction rub.   ABDOMEN: Soft, nontender, nondistended. Bowel sounds positive. No rebound or guarding. No gross organomegaly appreciated.   EXTREMITIES: No clubbing, cyanosis, or edema.   NEUROLOGIC: The patient is awake and oriented to self, place and partially to time.   MUSCULOSKELETAL: No joint redness, swelling or tenderness appreciated.   SKIN: Warm and dry. Diminished skin turgor noted.   GU: Foley catheter noted in place.   PSYCHIATRIC: The patient has an appropriate affect and appears to have some insight into his current illness.   LABORATORY, DIAGNOSTIC AND RADIOLOGICAL DATA: Renal ultrasound shows right kidney measuring 10.7 cm and a left kidney measuring 9.4 cm. There was bilateral renal cortical thinning. No hydronephrosis noted. Total bilirubin 0.6. Direct bilirubin 0.1, AST 17, ALT 16, albumin 2.5. BMP shows sodium 138, potassium 4.9, chloride 103, CO2  26, BUN 63, creatinine 3.3, glucose 186. Chest x-ray on 06/22/2011 shows bilateral diffuse interstitial thickening which could either represent edema or inflammation/infection. CBC shows WBC 16.4, hemoglobin 11.3, hematocrit 34, platelets 277. Urinalysis shows urine glucose of 150 mg/dL, urine protein 100 mg/dL, 169 RBCs per high-power field, 17 WBCs per high-power field. 2-D echocardiogram shows an ejection fraction normal at 55%. Right ventricle is moderately dilated. Left atrium is moderately dilated. There is moderate mitral regurgitation. There is moderate tricuspid regurgitation. Mild to moderate valvular aortic stenosis.   IMPRESSION: This is a 81 year old Caucasian male with past medical history of diabetes mellitus, advanced chronic obstructive pulmonary disease, cerebrovascular accident with residual right-sided weakness, coronary disease, anemia of chronic kidney disease, chronic kidney disease, stage IV, baseline creatinine 2.1, glaucoma, benign prostatic hypertrophy status post TURP, and hypothyroidism who presented to [facility] with altered mental status and found to have hypoglycemia.   PROBLEM LIST:  1. Acute renal failure, suspect acute tubular necrosis.  2. Chronic kidney disease stage III with baseline creatinine 2.1 with EGFR of 30.  3. Anemia of chronic kidney disease.  4. Shortness of breath. 5. Pulmonary edema versus pneumonitis.   PLAN: In regards to the patient's acute renal failure, I agree with Dr. Caryl Comes that the patient most likely has acute tubular necrosis. There are some signs of volume depletion including diminished skin turgor as well as extremely dry oral mucosa. However, there was a suggestion of either pulmonary edema versus pneumonitis on the chest x-ray. Given some signs of volume depletion, we will cautiously hydrate the patient with half-normal saline at  50 mL per hour. Hopefully, this will help to improve his circulating volume. Would hold  Lasix for now, but we are okay with as needed administrations for worsening shortness of breath. To treat the possibility of pneumonitis, I agree with use of Levaquin. The patient also appears to have mild anemia. I agree with SPEP and UPEP to further evaluate this. We will also follow up serum iron studies for this. Previously, the patient was noted to have a normal iron saturation of 22% on 10/03/2010. In addition, we had a brief discussion about the role for renal replacement therapy, particularly if his acute renal failure were to worsen. It appears that the patient has decided against renal replacement therapy should he develop the need. This is certainly reasonable given his underlying comorbidities.     I would like to thank Dr. Ramonita Lab for this kind referral. Further plan as the patient progresses.    ____________________________ Tama High, MD mnl:ap D: 06/23/2011 13:11:06 ET T: 06/23/2011 13:24:49 ET JOB#: [id number]  cc: Tama High, MD, <Dictator> Tama High MD ELECTRONICALLY SIGNED 06/28/2011 0:48

## 2014-07-26 NOTE — Consult Note (Signed)
PATIENT NAME:  Cappelletti, George Tate MR#:  [id number] DATE OF BIRTH:  10/27/1917  DATE OF CONSULTATION:  06/20/2011  REFERRING PHYSICIAN:  Dr. Gottlieb  CONSULTING PHYSICIAN:  Scott Tate. Stoioff, MD  REASON FOR CONSULTATION: Inability to place Foley catheter.   HISTORY OF PRESENT ILLNESS: The patient is a 81-year-old white male who presented to the Emergency Department after being found by his wife with breathing difficulty and decreased responsiveness. He is admitted for encephalopathy and hypoglycemia. Foley catheter was placed by the Emergency Department staff, however, no urine was obtained. Bladder scan showed an estimated volume of 900 mL. Review of the patient's chart is remarkable for a TURP in the remote, however, his wife had not had previous prostate surgery.   PAST MEDICAL HISTORY:  1. Chronic kidney disease, stage IV.  2. Insulin dependent diabetes mellitus.  3. Chronic obstructive pulmonary disease with chronic respiratory failure.  4. Congestive heart failure.  5. History of cerebrovascular accident.  6. Coronary artery disease. 7. Anemia.  8. Glaucoma.  9. Benign prostatic hypertrophy with prior TURP.  10. Hypothyroidism.  PAST SURGICAL HISTORY:  1. TURP. 2. Bilateral knee replacement.  3. Lumbar laminectomy.   MEDICATIONS ON ADMISSION:  1. Trusopt 2% eyedrops twice daily. 2. Tylenol as needed. 3. Toprol-XL 100 mg daily.  4. Symbicort 2 puffs inhaled b.i.d.  5. Rythmol 225 mg b.i.d.  6. Stool softener 100 mg b.i.d.  7. Norvasc 5 mg daily.  8. Levoxyl 100 mcg daily.  9. Lasix 40 mg daily.  10. Dulcolax 5 mg daily.  11. Centrum Silver daily. 12. Insulin 70/30 b.i.d.   REVIEW OF SYSTEMS: Unable to obtain.   PHYSICAL EXAMINATION:   GENERAL: Patient with decreased responsiveness, will open eyes to command.   ABDOMEN: Soft, nontender.   GU: Uncircumcised. Phallus without lesions. Foley catheter is in place, not draining and no urine is noted in the bag. Testes are  descended bilaterally. Prostate exam was deferred.   The Foley catheter was removed. A 14 French Coude catheter was placed after sterile prep/drape. No resistance was met, however, no urine was obtained. The catheter would not irrigate.   IMPRESSION: Urinary retention with inability to place Foley. No resistance was met. He may have a false passage or the catheter is curling up in the prostatic fossa.    RECOMMENDATION: Options were discussed with the patient's wife-bedside cystoscopy and attempt at catheter placement versus pubic tube placement in Radiology. She agreed to proceed with bedside cystoscopy and consent was obtained. Refer to the cystoscopy report.    ____________________________ Scott Tate. Stoioff, MD scs:drc D: 06/21/2011 08:33:34 ET T: 06/21/2011 10:29:33 ET JOB#: [id number]  cc: Scott Tate. Stoioff, MD, <Dictator> SCOTT Tate STOIOFF MD ELECTRONICALLY SIGNED 06/21/2011 11:08 

## 2014-07-26 NOTE — H&P (Signed)
PATIENT NAME:  George Tate, George Tate DATE OF BIRTH:  Jan 10, 1918  DATE OF ADMISSION:  08/09/2011  CHIEF COMPLAINT: Fever, decreased appetite, weakness, altered mental status, swelling of arms and legs.   HISTORY OF PRESENT ILLNESS: George Tate is a 81 year old male who was recently [facility] to rehab but has been disoriented and confused since he came home with decreased p.o. intake. The patient reportedly had not received any home health services and overall had become less responsive. The patient has a known history of COPD, congestive heart failure, type II diabetes, chronic kidney disease, and history of previous CVA. On arrival in the ED, the patient was noted to have fever of 103.2 with a heart rate of 123, oxygen sat of 90%.   PAST MEDICAL HISTORY:  1. Stage IV CKD, baseline creatinine 2.1. 2. Type II diabetes, insulin requiring.  3. Chronic obstructive pulmonary disease with chronic respiratory failure.  4. Congestive heart failure.  5. History of CVA with right-sided weakness.  6. Coronary artery disease.  7. Anemia.  8. Glaucoma.  9. Benign prostatic hypertrophy.  10. Hypothyroidism. 11. The patient also had been most [facility] with hypoglycemia, acute renal failure, dehydration, decreased urinary output and Methicillin-resistant Staphylococcus aureus urinary tract infection.   PAST SURGICAL HISTORY:  1. Bilateral knee replacements.  2. Lumbar laminectomy.   SOCIAL HISTORY: Negative for tobacco and alcohol.   FAMILY HISTORY: Positive for diabetes in his mother.   CURRENT MEDICATIONS:  1. Lipitor 20 mg at bedtime.  2. Symbicort 160/4.5 two puffs b.i.d.  3. Omeprazole 20 mg daily.  4. Alphagan 0.15% one drop to both eyes b.i.d. for glaucoma. 5. Trusopt eyedrops 2% one drop to both eyes b.i.d. for glaucoma. 6. Rythmol 225 mg p.o. b.i.d. for paroxysmal atrial fibrillation. 7. Insulin 70/30 12 units sub-Q in a.m., 8 units sub-Q p.m.   8. Toprol-XL 50 mg a day.  9. Synthroid 50 mcg a day.  10. DuoNeb SVN q.i.d. while awake. 11. Colace 100 mg p.o. b.i.d.  12. Enteric-coated aspirin 81 mg a day   ALLERGIES: He is allergic to codeine and Propine.   REVIEW OF SYSTEMS: The patient is unable to provide.   PHYSICAL EXAMINATION:   VITAL SIGNS: Temperature 103.2, pulse 102, respirations 18, blood pressure 101/53, oxygen sat 96% on 2 liters nasal cannula.  GENERAL: The patient was lethargic, was able to speak a few words but speech was slurred.   HEENT: Mucous membranes were dry. Airway was intact. He was awake, moving all extremities.   HEART: S1, S2.   LUNGS: Decreased air entry bilaterally with rhonchi.   ABDOMEN: Soft, nontender.   EXTREMITIES: Evidence of significant swelling noted in the left upper extremity with erythema extending from the wrist, hand, and upper arm. The patient also had 2+ edema in both lower extremities.   NEUROLOGIC: Awake, moving all four extremities but no obvious focal deficits other than right-sided weakness from previous CVA.   LABORATORY, DIAGNOSTIC, AND RADIOLOGICAL DATA: WBC count 14.1, hemoglobin 12.5, hematocrit 38.3, platelets 342, glucose 198, BUN 17, creatinine 2.2, sodium 139, potassium 3.5, chloride 99, CO2 27, calcium 8.8, AST 38, total protein 7.1, albumin 2.8. CK 418. MB 2.0. Troponin 0.27. ABG pH 7.40, pCO2 44, pO2 64, FiO2 28%, bicarb 27.3.   Chest x-ray cardiomegaly with pulmonary edema concerning for CHF, atherosclerotic disease was present with calcification of the aortic arch.   IMPRESSION AND PLAN:  1. Altered mental status with fever and dehydration.  The patient's source of infection appears unclear at this time. Possibilities include left arm cellulitis  and or underlying lung infectionn from aspiration Will obtain blood cultures x2,Sputum c `S. Check urinalysis C and S. Continue intravenous Rocephin. Add Levaquin 250 mg daily. Continue IV hydration with normal saline.   2. Type II diabetes. Will manage with  sliding scale.  3. Respiratory failure secondary to COPD. Continue DuoNebs q.4 to 6 hours p.r.n.  4. Hypothyroidism. Continue Synthroid.  5. History of atrial fibrillation. Continue Rythmol and Toprol-XL. 6. Acute on chronic kidney disease. Will follow renal function and continue IV hydration.  7. History of previous CVA. On aspirin 81 mg p.o. daily.  8. Chronic congestive heart failure. Will hold off on diuretics at this time.  9    Lovenox for DVT prophylaxis  PROGNOSIS: The patient's overall prognosis appears to be guarded.   CODE STATUS: He is a DO NOT RESUSCITATE.   ____________________________ Barbette Reichmann, MD vh:drc D: 08/09/2011 18:10:29 ET T: 08/10/2011 06:57:21 ET JOB#: [id number]  cc: Barbette Reichmann, MD, <Dictator> Barbette Reichmann MD ELECTRONICALLY SIGNED 08/10/2011 12:56
